# Patient Record
Sex: Female | Born: 1955
Health system: Southern US, Community
[De-identification: ages and names within clinical notes are randomized; demographics above are authoritative.]

## PROBLEM LIST (undated history)

## (undated) DIAGNOSIS — G4733 Obstructive sleep apnea (adult) (pediatric): Principal | ICD-10-CM

## (undated) DIAGNOSIS — D869 Sarcoidosis, unspecified: Secondary | ICD-10-CM

## (undated) DIAGNOSIS — E039 Hypothyroidism, unspecified: Secondary | ICD-10-CM

## (undated) DIAGNOSIS — R5383 Other fatigue: Secondary | ICD-10-CM

## (undated) DIAGNOSIS — E669 Obesity, unspecified: Secondary | ICD-10-CM

## (undated) DIAGNOSIS — R5381 Other malaise: Secondary | ICD-10-CM

## (undated) DIAGNOSIS — K219 Gastro-esophageal reflux disease without esophagitis: Secondary | ICD-10-CM

## (undated) HISTORY — DX: Obstructive sleep apnea (adult) (pediatric): G47.33

## (undated) HISTORY — DX: Hypothyroidism, unspecified: E03.9

## (undated) HISTORY — PX: ABDOMINAL HYSTERECTOMY: SHX81

## (undated) HISTORY — DX: Obesity, unspecified: E66.9

## (undated) HISTORY — DX: Other malaise: R53.81

## (undated) HISTORY — PX: TONSILLECTOMY: SUR1361

## (undated) HISTORY — DX: Sarcoidosis, unspecified: D86.9

## (undated) HISTORY — DX: Other fatigue: R53.83

## (undated) HISTORY — DX: Gastro-esophageal reflux disease without esophagitis: K21.9

---

## 2011-09-16 ENCOUNTER — Other Ambulatory Visit (HOSPITAL_COMMUNITY): Payer: Self-pay | Admitting: Physician Assistant

## 2011-09-16 DIAGNOSIS — Z0000 Encounter for general adult medical examination without abnormal findings: Secondary | ICD-10-CM

## 2011-09-28 HISTORY — PX: COLONOSCOPY: SHX174

## 2011-09-28 HISTORY — PX: ESOPHAGOGASTRODUODENOSCOPY: SHX1529

## 2011-10-03 ENCOUNTER — Ambulatory Visit (HOSPITAL_COMMUNITY)
Admission: RE | Admit: 2011-10-03 | Discharge: 2011-10-03 | Disposition: A | Discharge status: Home / Self Care | Payer: BC Managed Care – PPO | Source: Ambulatory Visit | Attending: Physician Assistant | Admitting: Physician Assistant

## 2011-10-03 DIAGNOSIS — Z0000 Encounter for general adult medical examination without abnormal findings: Secondary | ICD-10-CM

## 2011-10-03 DIAGNOSIS — Z1231 Encounter for screening mammogram for malignant neoplasm of breast: Secondary | ICD-10-CM | POA: Insufficient documentation

## 2011-10-06 ENCOUNTER — Other Ambulatory Visit: Payer: Self-pay | Admitting: Physician Assistant

## 2011-10-06 DIAGNOSIS — R928 Other abnormal and inconclusive findings on diagnostic imaging of breast: Secondary | ICD-10-CM

## 2011-10-19 ENCOUNTER — Ambulatory Visit (HOSPITAL_COMMUNITY)
Admission: RE | Admit: 2011-10-19 | Discharge: 2011-10-19 | Disposition: A | Discharge status: Home / Self Care | Payer: BC Managed Care – PPO | Source: Ambulatory Visit | Attending: Physician Assistant | Admitting: Physician Assistant

## 2011-10-19 DIAGNOSIS — R928 Other abnormal and inconclusive findings on diagnostic imaging of breast: Secondary | ICD-10-CM

## 2011-10-28 ENCOUNTER — Other Ambulatory Visit: Payer: Self-pay | Admitting: Gastroenterology

## 2011-10-28 DIAGNOSIS — R110 Nausea: Secondary | ICD-10-CM

## 2011-10-28 DIAGNOSIS — R1013 Epigastric pain: Secondary | ICD-10-CM

## 2011-10-31 ENCOUNTER — Ambulatory Visit
Admission: RE | Admit: 2011-10-31 | Discharge: 2011-10-31 | Disposition: A | Discharge status: Home / Self Care | Payer: BC Managed Care – PPO | Source: Ambulatory Visit | Attending: Gastroenterology | Admitting: Gastroenterology

## 2011-10-31 DIAGNOSIS — R110 Nausea: Secondary | ICD-10-CM

## 2011-10-31 DIAGNOSIS — R1013 Epigastric pain: Secondary | ICD-10-CM

## 2012-05-02 ENCOUNTER — Other Ambulatory Visit (HOSPITAL_COMMUNITY): Payer: Self-pay | Admitting: Internal Medicine

## 2012-05-02 DIAGNOSIS — Z09 Encounter for follow-up examination after completed treatment for conditions other than malignant neoplasm: Secondary | ICD-10-CM

## 2012-05-09 ENCOUNTER — Ambulatory Visit (HOSPITAL_COMMUNITY)
Admission: RE | Admit: 2012-05-09 | Discharge: 2012-05-09 | Disposition: A | Discharge status: Home / Self Care | Payer: BC Managed Care – PPO | Source: Ambulatory Visit | Attending: Internal Medicine | Admitting: Internal Medicine

## 2012-05-09 DIAGNOSIS — Z09 Encounter for follow-up examination after completed treatment for conditions other than malignant neoplasm: Secondary | ICD-10-CM

## 2012-09-04 ENCOUNTER — Other Ambulatory Visit (HOSPITAL_COMMUNITY): Payer: Self-pay | Admitting: Family Medicine

## 2012-09-04 DIAGNOSIS — Z09 Encounter for follow-up examination after completed treatment for conditions other than malignant neoplasm: Secondary | ICD-10-CM

## 2012-10-10 ENCOUNTER — Ambulatory Visit (HOSPITAL_COMMUNITY)
Admission: RE | Admit: 2012-10-10 | Discharge: 2012-10-10 | Disposition: A | Discharge status: Home / Self Care | Payer: BC Managed Care – PPO | Source: Ambulatory Visit | Attending: Family Medicine | Admitting: Family Medicine

## 2012-10-10 DIAGNOSIS — R928 Other abnormal and inconclusive findings on diagnostic imaging of breast: Secondary | ICD-10-CM | POA: Insufficient documentation

## 2012-10-10 DIAGNOSIS — Z09 Encounter for follow-up examination after completed treatment for conditions other than malignant neoplasm: Secondary | ICD-10-CM | POA: Insufficient documentation

## 2013-04-23 ENCOUNTER — Ambulatory Visit (INDEPENDENT_AMBULATORY_CARE_PROVIDER_SITE_OTHER): Payer: BC Managed Care – PPO | Admitting: Neurology

## 2013-04-23 ENCOUNTER — Encounter (INDEPENDENT_AMBULATORY_CARE_PROVIDER_SITE_OTHER): Payer: Self-pay

## 2013-04-23 ENCOUNTER — Encounter: Payer: Self-pay | Admitting: Neurology

## 2013-04-23 VITALS — BP 129/79 | HR 64 | Ht 62.0 in | Wt 192.0 lb

## 2013-04-23 DIAGNOSIS — G4733 Obstructive sleep apnea (adult) (pediatric): Secondary | ICD-10-CM | POA: Insufficient documentation

## 2013-04-23 HISTORY — DX: Obstructive sleep apnea (adult) (pediatric): G47.33

## 2013-04-23 NOTE — Patient Instructions (Signed)

## 2013-04-23 NOTE — Progress Notes (Signed)
Subjective:    Patient ID: Brooke Herring is a 57 y.o. female.  HPI   Brooke Foley, MD, PhD [facility] [address] [address] [place], [place] [address]  Dear Brooke Herring,  I saw your patient, Brooke Herring, upon your kind request in my neurologic clinic today for initial consultation of her sleep disorder, in particular concern for obstructive sleep apnea. The patient is unaccompanied today. As you know, Brooke Herring is a very friendly 57 year old right-handed woman with an underlying medical history of hypothyroidism, reflux disease, and obesity, who reports loud snoring and daytime somnolence as well as apneic pauses while asleep. She has recently made some changes in her lifestyle and is exercising, trying to lose weight. She has lost 6 lb in 2 months.  Her typical bedtime is reported to be around 10:30 PM and usual wake time is around 6:30 or 7 AM. Sleep onset typically occurs within a few minutes. She reports feeling well rested upon awakening. She wakes up on an average 2 to 3 times in the middle of the night and has to go to the bathroom 1 to 2 times on a typical night. She denies morning headaches.  She reports excessive daytime somnolence (EDS) and Her Epworth Sleepiness Score (ESS) is 13/24 today. She has not fallen asleep while driving. The patient often takes an afternoon nap, lasting 20-30 minutes. She reports feeling refreshed after a nap.  She has been known to snore for the past few years. Snoring is reportedly marked, and associated with choking sounds and witnessed apneas. The patient admits to waking up in a startle and in a panic. She generally denies a sense of choking or strangling feeling. There is no report of nighttime reflux, but she has to take Zantac. There is no nighttime cough experienced. The patient has not noted any RLS symptoms and is not known to kick while asleep or before falling asleep. There is a family history of OSA in her nephews. She  has a strong FHx of cardiac d/s. One brother passed away at age 57 from a MI. Her uncles had MIs and her father died of a MI.  She is a restless sleeper and in the morning, the bed is quite disheveled.   She denies cataplexy, sleep paralysis, hypnagogic or hypnopompic hallucinations, or sleep attacks. She does not report any vivid dreams, nightmares, dream enactments, or parasomnias, such as sleep talking or sleep walking. The patient has not had a sleep study or a home sleep test.  She consumes 4 caffeinated beverages per day, usually in the form of 2 cups of coffee in the morning and sweetened tea as late as dinner, which is at 5:30 PM.  Her bedroom is usually dark and cool. There is a TV in the bedroom and usually it is not on at night.   Her Past Medical History Is Significant For: Past Medical History  Diagnosis Date  . Unspecified hypothyroidism   . Obesity, unspecified   . Other malaise and fatigue   . OSA (obstructive sleep apnea) 04/23/2013    Her Past Surgical History Is Significant For: History reviewed. No pertinent past surgical history.  Her Family History Is Significant For: No family history on file.  Her Social History Is Significant For: History   Social History  . Marital Status: Married    Spouse Name: N/A    Number of Children: N/A  . Years of Education: N/A   Social History Main Topics  .  Smoking status: Former Smoker    Quit date: 04/24/2011  . Smokeless tobacco: None  . Alcohol Use: No  . Drug Use: No  . Sexual Activity: None   Other Topics Concern  . None   Social History Narrative  . None    Her Allergies Are:  Allergies  Allergen Reactions  . Azithromycin Other (See Comments)    Severe  HA  :   Her Current Medications Are:  Outpatient Encounter Prescriptions as of 04/23/2013  Medication Sig  . aspirin 81 MG tablet Take 81 mg by mouth daily.  Marland Kitchen levothyroxine (SYNTHROID, LEVOTHROID) 75 MCG tablet Take 75 mcg by mouth daily before  breakfast.  . ranitidine (ZANTAC) 150 MG tablet Take 150 mg by mouth at bedtime.  . Multiple Vitamin (MULTIVITAMIN) tablet Take 1 tablet by mouth daily.  :  Review of Systems:  Out of a complete 14 point review of systems, all are reviewed and negative with the exception of these symptoms as listed below:   Review of Systems  Constitutional: Positive for fatigue and unexpected weight change.  HENT: Negative.   Eyes: Negative.   Respiratory:       Snoring  Cardiovascular: Negative.   Gastrointestinal: Negative.   Endocrine: Positive for heat intolerance.  Genitourinary: Negative.   Musculoskeletal: Negative.   Skin: Negative.   Allergic/Immunologic: Negative.   Neurological: Negative.   Hematological: Negative.   Psychiatric/Behavioral: Positive for sleep disturbance.    Objective:  Neurologic Exam  Physical Exam Physical Examination:   Filed Vitals:   04/23/13 1344  BP: 129/79  Pulse: 64    General Examination: The patient is a very pleasant 57 y.o. female in no acute distress. She appears well-developed and well-nourished and very well groomed.   HEENT: Normocephalic, atraumatic, pupils are equal, round and reactive to light and accommodation. Funduscopic exam is normal with sharp disc margins noted. Extraocular tracking is good without limitation to gaze excursion or nystagmus noted. Normal smooth pursuit is noted. Hearing is grossly intact. Tympanic membranes are clear bilaterally. Face is symmetric with normal facial animation and normal facial sensation. Speech is clear with no dysarthria noted. There is no hypophonia. There is no lip, neck/head, jaw or voice tremor. Neck is supple with full range of passive and active motion. There are no carotid bruits on auscultation. Oropharynx exam reveals: mild mouth dryness, adequate dental hygiene and moderate airway crowding, due to redundant soft palate and larger tongue. Mallampati is class II. Tongue protrudes centrally and  palate elevates symmetrically. Tonsils are absent. Neck size is 14.5 inches.   Chest: Clear to auscultation without wheezing, rhonchi or crackles noted.  Heart: S1+S2+0, regular and normal without murmurs, rubs or gallops noted.   Abdomen: Soft, non-tender and non-distended with normal bowel sounds appreciated on auscultation.  Extremities: There is no pitting edema in the distal lower extremities bilaterally. Pedal pulses are intact.  Skin: Warm and dry without trophic changes noted. There are no varicose veins.  Musculoskeletal: exam reveals no obvious joint deformities, tenderness or joint swelling or erythema.   Neurologically:  Mental status: The patient is awake, alert and oriented in all 4 spheres. Her memory, attention, language and knowledge are appropriate. There is no aphasia, agnosia, apraxia or anomia. Speech is clear with normal prosody and enunciation. Thought process is linear. Mood is congruent and affect is normal.  Cranial nerves are as described above under HEENT exam. In addition, shoulder shrug is normal with equal shoulder height noted. Motor exam: Normal bulk,  strength and tone is noted. There is no drift, tremor or rebound. Romberg is negative. Reflexes are 2+ throughout. Toes are downgoing bilaterally. Fine motor skills are intact with normal finger taps, normal hand movements, normal rapid alternating patting, normal foot taps and normal foot agility.  Cerebellar testing shows no dysmetria or intention tremor on finger to nose testing. Heel to shin is unremarkable bilaterally. There is no truncal or gait ataxia.  Sensory exam is intact to light touch, pinprick, vibration, temperature sense and proprioception in the upper and lower extremities.  Gait, station and balance are unremarkable. No veering to one side is noted. No leaning to one side is noted. Posture is age-appropriate and stance is narrow based. No problems turning are noted. She turns en bloc. Tandem walk is  unremarkable. Intact toe and heel stance is noted.                Assessment and Plan:   In summary, Brooke Herring is a very pleasant 57 y.o. female with a history and physical exam concerning for obstructive sleep apnea (OSA). I had a long chat with the patient about my findings and the diagnosis, its prognosis and treatment options. We talked about medical treatments and non-pharmacological approaches. I explained in particular the risks and ramifications of untreated moderate to severe OSA, especially with respect to developing cardiovascular disease down the Road, including congestive heart failure, difficult to treat hypertension, cardiac arrhythmias, or stroke. Even type 2 diabetes has in part been linked to untreated OSA. We talked about trying to maintain a healthy lifestyle in general, as well as the importance of weight control. I encouraged the patient to eat healthy, exercise daily and keep well hydrated, to keep a scheduled bedtime and wake time routine, to not skip any meals and eat healthy snacks in between meals.  I recommended the following at this time: sleep study with potential positive airway pressure titration.  I explained the sleep test procedure to the patient and also outlined possible surgical and non-surgical treatment options of OSA, including the use of a custom-made dental device, upper airway surgical options, such as pillar implants, radiofrequency surgery, tongue base surgery, and UPPP. I also explained the CPAP treatment option to the patient, who indicated that she would be willing to try CPAP if the need arises. I explained the importance of being compliant with PAP treatment, not only for insurance purposes but primarily to improve Her symptoms, and for the patient's long term health benefit, including to reduce Her cardiovascular risks. I answered all her questions today and the patient was in agreement. I would like to see her back after the sleep study is completed and  encouraged her to call with any interim questions, concerns, problems or updates.   Thank you very much for allowing me to participate in the care of this nice patient. If I can be of any further assistance to you please do not hesitate to call me at [phone].  Sincerely,   Brooke Foley, MD, PhD

## 2013-05-20 ENCOUNTER — Ambulatory Visit (INDEPENDENT_AMBULATORY_CARE_PROVIDER_SITE_OTHER): Payer: BC Managed Care – PPO

## 2013-05-20 DIAGNOSIS — G4733 Obstructive sleep apnea (adult) (pediatric): Secondary | ICD-10-CM

## 2013-05-20 DIAGNOSIS — G479 Sleep disorder, unspecified: Secondary | ICD-10-CM

## 2013-06-05 ENCOUNTER — Telehealth: Payer: Self-pay | Admitting: Neurology

## 2013-06-05 DIAGNOSIS — G4733 Obstructive sleep apnea (adult) (pediatric): Secondary | ICD-10-CM

## 2013-06-05 NOTE — Telephone Encounter (Signed)
Please call and notify the patient that the recent sleep study did confirm the diagnosis of obstructive sleep apnea and that I recommend treatment for this in the form of CPAP. This will require a repeat sleep study for proper titration and mask fitting. Please explain to patient and arrange for a CPAP titration study. I have placed an order in the chart. Thanks, Roiza Wiedel, MD, PhD [facility]  

## 2013-06-06 ENCOUNTER — Encounter: Payer: Self-pay | Admitting: *Deleted

## 2013-06-06 NOTE — Telephone Encounter (Signed)
I called and spoke with the patient about her recent sleep study results. I informed the patient that the study confirmed the diagnosis of obstructive sleep apnea and Dr. Rexene Alberts recommend CPAP therapy. Patient understood that she will need to come back into the lab for another overnight study using CPAP for proper titration and mask fitting. Patient has agreed on a date and time. I will fax a copy to Delman Cheadle, PA-C and Dr. Loraine Leriche office.

## 2013-06-13 ENCOUNTER — Ambulatory Visit (INDEPENDENT_AMBULATORY_CARE_PROVIDER_SITE_OTHER): Payer: BC Managed Care – PPO

## 2013-06-13 DIAGNOSIS — G479 Sleep disorder, unspecified: Secondary | ICD-10-CM

## 2013-06-13 DIAGNOSIS — G4733 Obstructive sleep apnea (adult) (pediatric): Secondary | ICD-10-CM

## 2013-06-19 ENCOUNTER — Telehealth: Payer: Self-pay | Admitting: Neurology

## 2013-06-19 DIAGNOSIS — G4733 Obstructive sleep apnea (adult) (pediatric): Secondary | ICD-10-CM

## 2013-06-19 NOTE — Telephone Encounter (Signed)
Please call and inform patient that I have entered an order for treatment with PAP. She did well during the latest sleep study with CPAP. We will, therefore, arrange for a machine for home use through a DME (durable medical equipment) company of Her choice; and I will see the patient back in follow-up in about 6 weeks. Please also explain to the patient that I will be looking out for compliance data downloaded from the machine, which can be done remotely through a modem at times or stored on an SD card in the back of the machine. At the time of the followup appointment we will discuss sleep study results and how it is going with PAP treatment at home. Please advise patient to bring Her machine at the time of the visit; at least for the first visit, even though this is cumbersome. Bringing the machine for every visit after that may not be needed, but often helps for the first visit. Please also make sure, the patient has a follow-up appointment with me in about 6 weeks from the setup date, thanks.   Micah Galeno, MD, PhD [facility]  

## 2013-06-20 ENCOUNTER — Encounter: Payer: Self-pay | Admitting: *Deleted

## 2013-06-20 NOTE — Telephone Encounter (Signed)
I called and spoke with the patient about her sleep study results. I informed the patient that she did well on CPAP during the night of her study and Dr. Rexene Alberts recommend CPAP therapy at home. I will send the order to [facility] and they will contact the patient once receiving benefits for the CPAP machine. I also informed the patient that I will fax a copy of the report to Delman Cheadle, PA-C and mail a copy of the report along with a follow up instruction letter to the patient.

## 2013-09-19 ENCOUNTER — Emergency Department (HOSPITAL_COMMUNITY)
Admission: EM | Admit: 2013-09-19 | Discharge: 2013-09-19 | Disposition: A | Discharge status: Home / Self Care | Payer: BC Managed Care – PPO | Attending: Emergency Medicine | Admitting: Emergency Medicine

## 2013-09-19 ENCOUNTER — Encounter (HOSPITAL_COMMUNITY): Payer: Self-pay | Admitting: Emergency Medicine

## 2013-09-19 DIAGNOSIS — E039 Hypothyroidism, unspecified: Secondary | ICD-10-CM | POA: Insufficient documentation

## 2013-09-19 DIAGNOSIS — Z7982 Long term (current) use of aspirin: Secondary | ICD-10-CM | POA: Insufficient documentation

## 2013-09-19 DIAGNOSIS — E669 Obesity, unspecified: Secondary | ICD-10-CM | POA: Insufficient documentation

## 2013-09-19 DIAGNOSIS — H81399 Other peripheral vertigo, unspecified ear: Secondary | ICD-10-CM | POA: Insufficient documentation

## 2013-09-19 DIAGNOSIS — Z8669 Personal history of other diseases of the nervous system and sense organs: Secondary | ICD-10-CM | POA: Insufficient documentation

## 2013-09-19 DIAGNOSIS — Z79899 Other long term (current) drug therapy: Secondary | ICD-10-CM | POA: Insufficient documentation

## 2013-09-19 DIAGNOSIS — Z87891 Personal history of nicotine dependence: Secondary | ICD-10-CM | POA: Insufficient documentation

## 2013-09-19 LAB — CBC WITH DIFFERENTIAL/PLATELET
BASOS ABS: 0 10*3/uL (ref 0.0–0.1)
Basophils Relative: 1 % (ref 0–1)
Eosinophils Absolute: 0.2 10*3/uL (ref 0.0–0.7)
Eosinophils Relative: 3 % (ref 0–5)
HCT: 42.9 % (ref 36.0–46.0)
Hemoglobin: 13.7 g/dL (ref 12.0–15.0)
LYMPHS PCT: 37 % (ref 12–46)
Lymphs Abs: 1.9 10*3/uL (ref 0.7–4.0)
MCH: 30.4 pg (ref 26.0–34.0)
MCHC: 31.9 g/dL (ref 30.0–36.0)
MCV: 95.3 fL (ref 78.0–100.0)
Monocytes Absolute: 0.6 10*3/uL (ref 0.1–1.0)
Monocytes Relative: 11 % (ref 3–12)
NEUTROS ABS: 2.4 10*3/uL (ref 1.7–7.7)
NEUTROS PCT: 48 % (ref 43–77)
PLATELETS: 243 10*3/uL (ref 150–400)
RBC: 4.5 MIL/uL (ref 3.87–5.11)
RDW: 13.3 % (ref 11.5–15.5)
WBC: 5.1 10*3/uL (ref 4.0–10.5)

## 2013-09-19 LAB — BASIC METABOLIC PANEL
BUN: 13 mg/dL (ref 6–23)
CALCIUM: 9.4 mg/dL (ref 8.4–10.5)
CHLORIDE: 105 meq/L (ref 96–112)
CO2: 29 meq/L (ref 19–32)
Creatinine, Ser: 0.94 mg/dL (ref 0.50–1.10)
GFR calc Af Amer: 75 mL/min — ABNORMAL LOW (ref >90)
GFR calc non Af Amer: 65 mL/min — ABNORMAL LOW (ref >90)
Glucose, Bld: 106 mg/dL — ABNORMAL HIGH (ref 70–99)
Potassium: 4 mEq/L (ref 3.7–5.3)
SODIUM: 142 meq/L (ref 137–147)

## 2013-09-19 MED ORDER — LORAZEPAM 2 MG/ML IJ SOLN
1.0000 mg | Freq: Once | INTRAMUSCULAR | Status: AC
  Administered 2013-09-19: 1 mg via INTRAVENOUS
  Filled 2013-09-19: qty 1

## 2013-09-19 MED ORDER — MECLIZINE HCL 25 MG PO TABS: 25.0000 mg | ORAL_TABLET | Freq: Three times a day (TID) | ORAL | Status: DC | PRN

## 2013-09-19 MED ORDER — ONDANSETRON HCL 4 MG PO TABS: 4.0000 mg | ORAL_TABLET | Freq: Four times a day (QID) | ORAL | Status: DC | PRN

## 2013-09-19 MED ORDER — ONDANSETRON HCL 4 MG/2ML IJ SOLN
4.0000 mg | Freq: Once | INTRAMUSCULAR | Status: AC
  Administered 2013-09-19: 4 mg via INTRAVENOUS
  Filled 2013-09-19: qty 2

## 2013-09-19 MED ORDER — MECLIZINE HCL 12.5 MG PO TABS
25.0000 mg | ORAL_TABLET | Freq: Once | ORAL | Status: AC
  Administered 2013-09-19: 25 mg via ORAL
  Filled 2013-09-19: qty 2

## 2013-09-19 NOTE — ED Notes (Signed)
Dr. Roxanne Mins at bedside to reassess.

## 2013-09-19 NOTE — ED Notes (Signed)
Per EMS, patient felt fine when she went to bed last night.  She states she got up this morning and felt like something came loose in her brain.  Patient c/o not being able to sit up or stand without feeling like she's going to pass out.

## 2013-09-19 NOTE — ED Notes (Signed)
Patient ambulated around nurse's station with steady gait. Returned to room without complication.

## 2013-09-19 NOTE — ED Notes (Signed)
Patient with no complaints at this time. Respirations even and unlabored. Skin warm/dry. Discharge instructions reviewed with patient at this time. Patient given opportunity to voice concerns/ask questions. IV removed per policy and band-aid applied to site. Patient discharged at this time and left Emergency Department with steady gait.  

## 2013-09-19 NOTE — ED Notes (Signed)
Patient is able to turn over to her side without getting dizzy.  HOB elevated to 30 degrees without dizziness.

## 2013-09-19 NOTE — Discharge Instructions (Signed)
Vertigo Vertigo means you feel like you or your surroundings are moving when they are not. Vertigo can be dangerous if it occurs when you are at work, driving, or performing difficult activities.  CAUSES  Vertigo occurs when there is a conflict of signals sent to your brain from the visual and sensory systems in your body. There are many different causes of vertigo, including:  Infections, especially in the inner ear.  A bad reaction to a drug or misuse of alcohol and medicines.  Withdrawal from drugs or alcohol.  Rapidly changing positions, such as lying down or rolling over in bed.  A migraine headache.  Decreased blood flow to the brain.  Increased pressure in the brain from a head injury, infection, tumor, or bleeding. SYMPTOMS  You may feel as though the world is spinning around or you are falling to the ground. Because your balance is upset, vertigo can cause nausea and vomiting. You may have involuntary eye movements (nystagmus). DIAGNOSIS  Vertigo is usually diagnosed by physical exam. If the cause of your vertigo is unknown, your caregiver may perform imaging tests, such as an MRI scan (magnetic resonance imaging). TREATMENT  Most cases of vertigo resolve on their own, without treatment. Depending on the cause, your caregiver may prescribe certain medicines. If your vertigo is related to body position issues, your caregiver may recommend movements or procedures to correct the problem. In rare cases, if your vertigo is caused by certain inner ear problems, you may need surgery. HOME CARE INSTRUCTIONS   Follow your caregiver's instructions.  Avoid driving.  Avoid operating heavy machinery.  Avoid performing any tasks that would be dangerous to you or others during a vertigo episode.  Tell your caregiver if you notice that certain medicines seem to be causing your vertigo. Some of the medicines used to treat vertigo episodes can actually make them worse in some people. SEEK  IMMEDIATE MEDICAL CARE IF:   Your medicines do not relieve your vertigo or are making it worse.  You develop problems with talking, walking, weakness, or using your arms, hands, or legs.  You develop severe headaches.  Your nausea or vomiting continues or gets worse.  You develop visual changes.  A family member notices behavioral changes.  Your condition gets worse. MAKE SURE YOU:  Understand these instructions.  Will watch your condition.  Will get help right away if you are not doing well or get worse. Document Released: 02/23/2005 Document Revised: 08/08/2011 Document Reviewed: 12/02/2010 [facility] Patient Information 2014 [employer].  Meclizine tablets or capsules What is this medicine? MECLIZINE (MEK li zeen) is an antihistamine. It is used to prevent nausea, vomiting, or dizziness caused by motion sickness. It is also used to prevent and treat vertigo (extreme dizziness or a feeling that you or your surroundings are tilting or spinning around). This medicine may be used for other purposes; ask your health care provider or pharmacist if you have questions. COMMON BRAND NAME(S): Antivert, Dramamine Less Drowsy, Medivert, Meni-D  What should I tell my health care provider before I take this medicine? They need to know if you have any of these conditions: -asthma -glaucoma -prostate trouble -stomach problems -urinary problems -an unusual or allergic reaction to meclizine, other medicines, foods, dyes, or preservatives -pregnant or trying to get pregnant -breast-feeding How should I use this medicine? Take this medicine by mouth with a glass of water. Follow the directions on the prescription label. If you are using this medicine to prevent motion sickness, take  the dose at least 1 hour before travel. If it upsets your stomach, take it with food or milk. Take your doses at regular intervals. Do not take your medicine more often than directed. °Talk to your pediatrician  regarding the use of this medicine in children. Special care may be needed. °Overdosage: If you think you have taken too much of this medicine contact a poison control center or emergency room at once. °NOTE: This medicine is only for you. Do not share this medicine with others. °What if I miss a dose? °If you miss a dose, take it as soon as you can. If it is almost time for your next dose, take only that dose. Do not take double or extra doses. °What may interact with this medicine? °-barbiturate medicines for inducing sleep or treating seizures °-digoxin °-medicines for anxiety or sleeping problems, like alprazolam, diazepam or temazepam °-medicines for hay fever and other allergies °-medicines for mental depression °-medicines for movement abnormalities as in Parkinson's disease, or for stomach problems °-medicines for pain °-medicines that relax muscles °This list may not describe all possible interactions. Give your health care provider a list of all the medicines, herbs, non-prescription drugs, or dietary supplements you use. Also tell them if you smoke, drink alcohol, or use illegal drugs. Some items may interact with your medicine. °What should I watch for while using this medicine? °If you are taking this medicine on a regular schedule, visit your doctor or health care professional for regular checks on your progress. °You may get dizzy, drowsy or have blurred vision. Do not drive, use machinery, or do anything that needs mental alertness until you know how this medicine affects you. Do not stand or sit up quickly, especially if you are an older patient. This reduces the risk of dizzy or fainting spells. Alcohol can increase possible dizziness. Avoid alcoholic drinks. °Your mouth may get dry. Chewing sugarless gum or sucking hard candy, and drinking plenty of water may help. Contact your doctor if the problem does not go away or is severe. °This medicine may cause dry eyes and blurred vision. If you wear  contact lenses you may feel some discomfort. Lubricating drops may help. See your eye doctor if the problem does not go away or is severe. °What side effects may I notice from receiving this medicine? °Side effects that you should report to your doctor or health care professional as soon as possible: °-fainting spells °-fast or irregular heartbeat °Side effects that usually do not require medical attention (report to your doctor or health care professional if they continue or are bothersome): °-constipation °-difficulty passing urine °-difficulty sleeping °-headache °-stomach upset °This list may not describe all possible side effects. Call your doctor for medical advice about side effects. You may report side effects to FDA at 1-800-FDA-1088. °Where should I keep my medicine? °Keep out of the reach of children. °Store at room temperature between 15 and 30 degrees C (59 and 86 degrees F). Keep container tightly closed. Throw away any unused medicine after the expiration date. °NOTE: This sheet is a summary. It may not cover all possible information. If you have questions about this medicine, talk to your doctor, pharmacist, or health care provider. °© 2014, [employer]. (2007-11-22 10:35:36) ° °

## 2013-09-19 NOTE — ED Notes (Signed)
Patient attempted to use bedpan without success; states she cannot use the bathroom lying down.  I&O cath completed and patient states she feels better.

## 2013-09-19 NOTE — ED Provider Notes (Signed)
CSN: [id number]     Arrival date & time 09/19/13  4166 History   First MD Initiated Contact with Patient 09/19/13 0531     Chief Complaint  Patient presents with  . Dizziness     (Consider location/radiation/quality/duration/timing/severity/associated sxs/prior Treatment) Patient is a 58 y.o. female presenting with dizziness. The history is provided by the patient.  Dizziness She woke up to go to the bathroom and when she sat up, and she had a feeling like something came loose in her brain. She felt like she was going to pass out and there is associated nausea. She lay down and felt fine. Since then, anytime she tries to sit up, symptoms recur. She denies headache or visual change. She denies hearing loss or tinnitus and denies ear pain. There's been no vomiting. She felt fine when she went to bed. She's never had anything like this happen before.  Past Medical History  Diagnosis Date  . Unspecified hypothyroidism   . Obesity, unspecified   . Other malaise and fatigue   . OSA (obstructive sleep apnea) 04/23/2013   History reviewed. No pertinent past surgical history. No family history on file. History  Substance Use Topics  . Smoking status: Former Smoker    Quit date: 04/24/2011  . Smokeless tobacco: Not on file  . Alcohol Use: No   OB History   Grav Para Term Preterm Abortions TAB SAB Ect Mult Living                 Review of Systems  Neurological: Positive for dizziness.  All other systems reviewed and are negative.     Allergies  Azithromycin  Home Medications   Prior to Admission medications   Medication Sig Start Date End Date Taking? Authorizing Provider  aspirin 81 MG tablet Take 81 mg by mouth daily.   Yes Historical Provider, MD  levothyroxine (SYNTHROID, LEVOTHROID) 75 MCG tablet Take 75 mcg by mouth daily before breakfast.   Yes Historical Provider, MD  Multiple Vitamin (MULTIVITAMIN) tablet Take 1 tablet by mouth daily.   Yes Historical Provider, MD   ranitidine (ZANTAC) 150 MG tablet Take 150 mg by mouth at bedtime.   Yes Historical Provider, MD   BP 141/76  Pulse 65  Temp(Src) 98.3 F (36.8 C) (Oral)  Resp 18  Ht 5\' 3"  (1.6 m)  Wt 197 lb (89.359 kg)  BMI 34.91 kg/m2  SpO2 99% Physical Exam  Nursing note and vitals reviewed.  58 year old female, resting comfortably and in no acute distress. Vital signs are significant for borderline hypertension with blood pressure 141/76. Oxygen saturation is 99%, which is normal. Head is normocephalic and atraumatic. PERRLA, EOMI. Oropharynx is clear. There is no nystagmus. Neck is nontender and supple without adenopathy or JVD. Back is nontender and there is no CVA tenderness. Lungs are clear without rales, wheezes, or rhonchi. Chest is nontender. Heart has regular rate and rhythm without murmur. Abdomen is soft, flat, nontender without masses or hepatosplenomegaly and peristalsis is normoactive. Extremities have no cyanosis or edema, full range of motion is present. Skin is warm and dry without rash. Neurologic: Mental status is normal, cranial nerves are intact, there are no motor or sensory deficits. Symptoms are reproduced by head movement, raising head past 15 degrees.  ED Course  Procedures (including critical care time) Labs Review Results for orders placed during the hospital encounter of 09/19/13  CBC WITH DIFFERENTIAL      Result Value Ref Range   WBC 5.1  4.0 - 10.5 K/uL   RBC 4.50  3.87 - 5.11 MIL/uL   Hemoglobin 13.7  12.0 - 15.0 g/dL   HCT 42.9  36.0 - 46.0 %   MCV 95.3  78.0 - 100.0 fL   MCH 30.4  26.0 - 34.0 pg   MCHC 31.9  30.0 - 36.0 g/dL   RDW 13.3  11.5 - 15.5 %   Platelets 243  150 - 400 K/uL   Neutrophils Relative % 48  43 - 77 %   Neutro Abs 2.4  1.7 - 7.7 K/uL   Lymphocytes Relative 37  12 - 46 %   Lymphs Abs 1.9  0.7 - 4.0 K/uL   Monocytes Relative 11  3 - 12 %   Monocytes Absolute 0.6  0.1 - 1.0 K/uL   Eosinophils Relative 3  0 - 5 %   Eosinophils  Absolute 0.2  0.0 - 0.7 K/uL   Basophils Relative 1  0 - 1 %   Basophils Absolute 0.0  0.0 - 0.1 K/uL  BASIC METABOLIC PANEL      Result Value Ref Range   Sodium 142  137 - 147 mEq/L   Potassium 4.0  3.7 - 5.3 mEq/L   Chloride 105  96 - 112 mEq/L   CO2 29  19 - 32 mEq/L   Glucose, Bld 106 (*) 70 - 99 mg/dL   BUN 13  6 - 23 mg/dL   Creatinine, Ser 0.94  0.50 - 1.10 mg/dL   Calcium 9.4  8.4 - 10.5 mg/dL   GFR calc non Af Amer 65 (*) >90 mL/min   GFR calc Af Amer 75 (*) >90 mL/min   MDM   Final diagnoses:  Peripheral vertigo    Acute vertigo with pattern most consistent with peripheral vertigo. She'll be given to repeat a trial of meclizine. Consider MRI if she does not have adequate clinical response.  7:07 AM Nausea has completely resolved following ondansetron. Following oral meclizine, and she is now able to sit up although she still has some mild dizziness when she sits. She will be given a dose of lorazepam and she will be ambulated with assistance in the ED.  8:06 AM The patient successfully intubated with assistance and then was able to ambulate without assistance. She is stable when ambulating and walks with a steady gait. She is felt to be safe for discharge. She is discharged with prescriptions for meclizine and ondansetron.  Delora Fuel, MD 19/50/93 2671

## 2013-10-23 ENCOUNTER — Other Ambulatory Visit (HOSPITAL_COMMUNITY): Payer: Self-pay | Admitting: Internal Medicine

## 2013-10-23 DIAGNOSIS — Z1231 Encounter for screening mammogram for malignant neoplasm of breast: Secondary | ICD-10-CM

## 2013-10-29 ENCOUNTER — Ambulatory Visit (HOSPITAL_COMMUNITY)
Admission: RE | Admit: 2013-10-29 | Discharge: 2013-10-29 | Disposition: A | Discharge status: Home / Self Care | Payer: BC Managed Care – PPO | Source: Ambulatory Visit | Attending: Internal Medicine | Admitting: Internal Medicine

## 2013-10-29 DIAGNOSIS — Z1231 Encounter for screening mammogram for malignant neoplasm of breast: Secondary | ICD-10-CM

## 2014-01-08 ENCOUNTER — Telehealth: Payer: Self-pay

## 2014-01-08 NOTE — Telephone Encounter (Signed)
Tried to call back to make an appointment with no answer

## 2014-01-15 NOTE — Telephone Encounter (Signed)
Pt is aware of her appointment on August 25 @ 1100

## 2014-01-21 ENCOUNTER — Encounter: Payer: Self-pay | Admitting: Gastroenterology

## 2014-01-21 ENCOUNTER — Ambulatory Visit (INDEPENDENT_AMBULATORY_CARE_PROVIDER_SITE_OTHER): Payer: BC Managed Care – PPO | Admitting: Gastroenterology

## 2014-01-21 ENCOUNTER — Encounter (INDEPENDENT_AMBULATORY_CARE_PROVIDER_SITE_OTHER): Payer: Self-pay

## 2014-01-21 VITALS — BP 130/70 | HR 75 | Temp 98.0°F | Ht 63.0 in | Wt 202.6 lb

## 2014-01-21 DIAGNOSIS — K219 Gastro-esophageal reflux disease without esophagitis: Secondary | ICD-10-CM | POA: Insufficient documentation

## 2014-01-21 DIAGNOSIS — Z860100 Personal history of colon polyps, unspecified: Secondary | ICD-10-CM | POA: Insufficient documentation

## 2014-01-21 MED ORDER — DEXLANSOPRAZOLE 60 MG PO CPDR: 60.0000 mg | DELAYED_RELEASE_CAPSULE | Freq: Every day | ORAL | Status: DC

## 2014-01-21 MED ORDER — PEG 3350-KCL-NA BICARB-NACL 420 G PO SOLR: 4000.0000 mL | ORAL | Status: DC

## 2014-01-21 NOTE — Progress Notes (Signed)
Primary Care Physician:  Purvis Kilts, MD Primary Gastroenterologist:  Dr. Gala Romney   Chief Complaint  Patient presents with  . Colonoscopy  . Gastrophageal Reflux  . EGD    HPI:   Brooke Herring presents today at the request of Dr. Hilma Favors to schedule surveillance colonoscopy. Last colonoscopy 3 years ago at [facility] in [place]. History of failed sedation in the past. Intermittent low-volume hematochezia. Has hemorrhoids. Tries to eat a high fiber diet. Preparation H as needed. If splurges and eats something fried will have diarrhea. No straining. Occasional abdominal pain. Severe GERD. Worse in the mornings. Sometimes gags while brushing her teeth. Takes Zantac at bedtime. Has tried multiple different PPIs and has to keep switching as it loses effect. No N/V. No dysphagia. Last EGD about 3 years ago. Told she had severe acid reflux. Worsening.   Operative reports not available at time of appt. Patient desires EGD at time of colonoscopy.   Past Medical History  Diagnosis Date  . Unspecified hypothyroidism   . Obesity, unspecified   . Other malaise and fatigue   . OSA (obstructive sleep apnea) 04/23/2013    no cpap    Past Surgical History  Procedure Laterality Date  . Abdominal hysterectomy    . Tonsillectomy      Current Outpatient Prescriptions  Medication Sig Dispense Refill  . aspirin 81 MG tablet Take 81 mg by mouth daily.      Marland Kitchen levothyroxine (SYNTHROID, LEVOTHROID) 75 MCG tablet Take 75 mcg by mouth daily before breakfast.      . Multiple Vitamin (MULTIVITAMIN) tablet Take 1 tablet by mouth daily.      . ranitidine (ZANTAC) 150 MG tablet Take 150 mg by mouth at bedtime.      . meclizine (ANTIVERT) 25 MG tablet Take 1 tablet (25 mg total) by mouth 3 (three) times daily as needed for dizziness.  30 tablet  0  . ondansetron (ZOFRAN) 4 MG tablet Take 1 tablet (4 mg total) by mouth every 6 (six) hours as needed for nausea.  12 tablet  0   No current  facility-administered medications for this visit.    Allergies as of 01/21/2014 - Review Complete 01/21/2014  Allergen Reaction Noted  . Azithromycin Other (See Comments) 04/23/2013    Family History  Problem Relation Age of Onset  . Colon cancer Neg Hx     History   Social History  . Marital Status: Married    Spouse Name: N/A    Number of Children: N/A  . Years of Education: N/A   Occupational History  . housewife    Social History Main Topics  . Smoking status: Former Smoker    Quit date: 04/24/2011  . Smokeless tobacco: Not on file  . Alcohol Use: Yes     Comment: occasional glass of wine  . Drug Use: No  . Sexual Activity: Not on file   Other Topics Concern  . Not on file   Social History Narrative  . No narrative on file    Review of Systems: As mentioned in HPI.   Physical Exam: BP 130/70  Pulse 75  Temp(Src) 98 F (36.7 C) (Oral)  Ht 5\' 3"  (1.6 m)  Wt 202 lb 9.6 oz (91.899 kg)  BMI 35.90 kg/m2 General:   Alert and oriented. Pleasant and cooperative. Well-nourished and well-developed.  Head:  Normocephalic and atraumatic. Eyes:  Without icterus, sclera clear and conjunctiva pink.  Ears:  Normal auditory  acuity. Nose:  No deformity, discharge,  or lesions. Mouth:  No deformity or lesions, oral mucosa pink.  Neck:  Supple, without mass or thyromegaly. Lungs:  Clear to auscultation bilaterally. No wheezes, rales, or rhonchi. No distress.  Heart:  S1, S2 present without murmurs appreciated.  Abdomen:  +BS, soft, non-tender and non-distended. No HSM noted. No guarding or rebound. No masses appreciated.  Rectal:  Deferred  Msk:  Symmetrical without gross deformities. Normal posture. Extremities:  Without clubbing or edema. Neurologic:  Alert and  oriented x4;  grossly normal neurologically. Skin:  Intact without significant lesions or rashes. Cervical Nodes:  No significant cervical adenopathy. Psych:  Alert and cooperative. Normal mood and  affect.

## 2014-01-21 NOTE — Patient Instructions (Signed)
I have provided samples of Dexilant to start taking once daily at lunch. I sent the prescription to the pharmacy in case you would like to continue this.   We have scheduled you for a colonoscopy and upper endoscopy with Dr. Gala Romney in the near future!

## 2014-01-26 NOTE — Assessment & Plan Note (Signed)
58 year old female with personal history of colon polyps, reporting need for surveillance at this time, stating she has been told to complete every 3 years. Operative notes not available att time of visit. Have requested from [facility]. Low-volume hematochezia likely benign anorectal source.  Proceed with TCS with Dr. Gala Romney in near future: the risks, benefits, and alternatives have been discussed with the patient in detail. The patient states understanding and desires to proceed.

## 2014-01-26 NOTE — Assessment & Plan Note (Signed)
Uncontrolled GERD, failing multiple PPIs in the past. Currently not on any PPI but utilizes Zantac in the evenings. GERD worsening but without dysphagia. Desires EGD at time of colonoscopy for evaluation. Although she is without any concerning symptoms and is not on a PPI, she states she would have peace of mind if this is completed. Will trial Dexilant in the interim and proceed with EGD at time of colonoscopy. Obtain any outside reports from [facility].

## 2014-01-29 ENCOUNTER — Encounter (HOSPITAL_COMMUNITY): Payer: Self-pay | Admitting: Pharmacy Technician

## 2014-01-31 NOTE — Progress Notes (Signed)
Cc to pcp °

## 2014-02-11 ENCOUNTER — Encounter: Payer: Self-pay | Admitting: Gastroenterology

## 2014-02-11 NOTE — Progress Notes (Signed)
I received outside records from EGD and colonoscopy.   Last colonoscopy in May 2013 with hyperplastic polyps. SHE HAD INTERNAL HEMORRHOIDS. She is not due for surveillance until 2018.   Last EGD in May 2013 with normal gastric mucosa and abnormal duodenum. PATH without any evidence of H.pylori or other abnormalities.    At this point, we need to cancel the colonoscopy and EGD. It has only been a little over 2 years since her last exam. Likely low-volume hematochezia is secondary to internal hemorrhoids. GERD is severe and worsening, but needs to continue course of Dexilant right now before proceeding with an EGD.

## 2014-02-12 NOTE — Progress Notes (Signed)
Pt is aware that she will not be having her TCS/EGD done. She said that was fine. She also stated that her insurance will not cover the [place]. Is there something different she can try. Please advise

## 2014-02-13 ENCOUNTER — Ambulatory Visit (HOSPITAL_COMMUNITY)
Admission: RE | Admit: 2014-02-13 | Payer: BC Managed Care – PPO | Source: Ambulatory Visit | Admitting: Internal Medicine

## 2014-02-13 ENCOUNTER — Encounter (HOSPITAL_COMMUNITY): Admission: RE | Payer: Self-pay | Source: Ambulatory Visit

## 2014-02-13 SURGERY — COLONOSCOPY
Anesthesia: Moderate Sedation

## 2014-02-24 NOTE — Progress Notes (Signed)
Tried/failed multiple PPIs. Let's see if it is a PA issue. If so, provide samples until we can hear back from insurance regarding [place].

## 2014-03-26 NOTE — Progress Notes (Signed)
I have tried multiple time to reach pt to find out what ppi's she has taken in the past. Unable to reach her and no return call. Letter mailed to her.

## 2014-04-02 ENCOUNTER — Telehealth: Payer: Self-pay | Admitting: Internal Medicine

## 2014-04-02 NOTE — Telephone Encounter (Signed)
Patient received letter from [facility] to call. Please call patient around 330pm if possible at [phone]

## 2014-04-11 NOTE — Telephone Encounter (Signed)
Tried to call pt- NA 

## 2014-04-16 NOTE — Telephone Encounter (Signed)
Tried to call- NA, if pt calls when I am not here, I need to know what PPI's she has tried so I can do her prior authorization for dexilant.

## 2014-04-18 NOTE — Telephone Encounter (Signed)
I finally spoke with the pt. She said she has been out of town for several weeks and the only reason they are home now is because pt's husband had a heart attack and has been at [facility] for over a week. She said she has tried, omeprazole, pantoprazole, nexium, zantac, zegerid. Will fill out PA form and send to the insurance. She said dexilant worked very well.

## 2014-04-18 NOTE — Telephone Encounter (Signed)
PA has been sent to the insurance company. 

## 2014-04-21 NOTE — Telephone Encounter (Signed)
rx was approved. Pt is aware. copay card at the front desk for her to pick up.

## 2014-09-24 ENCOUNTER — Other Ambulatory Visit (HOSPITAL_COMMUNITY): Payer: Self-pay | Admitting: Family Medicine

## 2014-09-24 DIAGNOSIS — Z139 Encounter for screening, unspecified: Secondary | ICD-10-CM

## 2014-09-25 ENCOUNTER — Other Ambulatory Visit (HOSPITAL_COMMUNITY): Payer: Self-pay | Admitting: Family Medicine

## 2014-09-25 DIAGNOSIS — Z1231 Encounter for screening mammogram for malignant neoplasm of breast: Secondary | ICD-10-CM

## 2014-09-29 ENCOUNTER — Ambulatory Visit (HOSPITAL_COMMUNITY)
Admission: RE | Admit: 2014-09-29 | Discharge: 2014-09-29 | Disposition: A | Discharge status: Home / Self Care | Payer: 59 | Source: Ambulatory Visit | Attending: Family Medicine | Admitting: Family Medicine

## 2014-09-29 DIAGNOSIS — Z780 Asymptomatic menopausal state: Secondary | ICD-10-CM | POA: Insufficient documentation

## 2014-09-29 DIAGNOSIS — Z139 Encounter for screening, unspecified: Secondary | ICD-10-CM

## 2014-09-29 DIAGNOSIS — R29890 Loss of height: Secondary | ICD-10-CM | POA: Diagnosis not present

## 2014-11-03 ENCOUNTER — Ambulatory Visit (HOSPITAL_COMMUNITY)
Admission: RE | Admit: 2014-11-03 | Discharge: 2014-11-03 | Disposition: A | Discharge status: Home / Self Care | Payer: 59 | Source: Ambulatory Visit | Attending: Family Medicine | Admitting: Family Medicine

## 2014-11-03 DIAGNOSIS — Z1231 Encounter for screening mammogram for malignant neoplasm of breast: Secondary | ICD-10-CM | POA: Diagnosis not present

## 2014-11-05 ENCOUNTER — Other Ambulatory Visit: Payer: Self-pay | Admitting: Family Medicine

## 2014-11-05 DIAGNOSIS — R928 Other abnormal and inconclusive findings on diagnostic imaging of breast: Secondary | ICD-10-CM

## 2014-11-25 ENCOUNTER — Ambulatory Visit (HOSPITAL_COMMUNITY)
Admission: RE | Admit: 2014-11-25 | Discharge: 2014-11-25 | Disposition: A | Discharge status: Home / Self Care | Payer: 59 | Source: Ambulatory Visit | Attending: Family Medicine | Admitting: Family Medicine

## 2014-11-25 ENCOUNTER — Other Ambulatory Visit (HOSPITAL_COMMUNITY): Payer: Self-pay | Admitting: Family Medicine

## 2014-11-25 DIAGNOSIS — R922 Inconclusive mammogram: Secondary | ICD-10-CM

## 2014-11-25 DIAGNOSIS — N6489 Other specified disorders of breast: Secondary | ICD-10-CM | POA: Insufficient documentation

## 2014-11-25 DIAGNOSIS — R928 Other abnormal and inconclusive findings on diagnostic imaging of breast: Secondary | ICD-10-CM

## 2015-10-16 DIAGNOSIS — E063 Autoimmune thyroiditis: Secondary | ICD-10-CM | POA: Diagnosis not present

## 2015-10-16 DIAGNOSIS — Z0001 Encounter for general adult medical examination with abnormal findings: Secondary | ICD-10-CM | POA: Diagnosis not present

## 2015-10-16 DIAGNOSIS — Z6836 Body mass index (BMI) 36.0-36.9, adult: Secondary | ICD-10-CM | POA: Diagnosis not present

## 2015-10-16 DIAGNOSIS — Z1389 Encounter for screening for other disorder: Secondary | ICD-10-CM | POA: Diagnosis not present

## 2015-10-20 DIAGNOSIS — Z1389 Encounter for screening for other disorder: Secondary | ICD-10-CM | POA: Diagnosis not present

## 2015-10-20 DIAGNOSIS — Z0001 Encounter for general adult medical examination with abnormal findings: Secondary | ICD-10-CM | POA: Diagnosis not present

## 2015-10-20 DIAGNOSIS — R739 Hyperglycemia, unspecified: Secondary | ICD-10-CM | POA: Diagnosis not present

## 2015-10-22 ENCOUNTER — Other Ambulatory Visit (HOSPITAL_COMMUNITY): Payer: Self-pay | Admitting: Internal Medicine

## 2015-10-22 DIAGNOSIS — Z1231 Encounter for screening mammogram for malignant neoplasm of breast: Secondary | ICD-10-CM

## 2015-11-26 ENCOUNTER — Ambulatory Visit (HOSPITAL_COMMUNITY)
Admission: RE | Admit: 2015-11-26 | Discharge: 2015-11-26 | Disposition: A | Discharge status: Home / Self Care | Payer: BLUE CROSS/BLUE SHIELD | Source: Ambulatory Visit | Attending: Internal Medicine | Admitting: Internal Medicine

## 2015-11-26 DIAGNOSIS — Z1231 Encounter for screening mammogram for malignant neoplasm of breast: Secondary | ICD-10-CM | POA: Diagnosis not present

## 2016-05-05 DIAGNOSIS — L659 Nonscarring hair loss, unspecified: Secondary | ICD-10-CM | POA: Diagnosis not present

## 2016-05-05 DIAGNOSIS — Z6836 Body mass index (BMI) 36.0-36.9, adult: Secondary | ICD-10-CM | POA: Diagnosis not present

## 2016-05-05 DIAGNOSIS — E6609 Other obesity due to excess calories: Secondary | ICD-10-CM | POA: Diagnosis not present

## 2016-05-05 DIAGNOSIS — Z23 Encounter for immunization: Secondary | ICD-10-CM | POA: Diagnosis not present

## 2016-05-05 DIAGNOSIS — Z1389 Encounter for screening for other disorder: Secondary | ICD-10-CM | POA: Diagnosis not present

## 2016-05-18 DIAGNOSIS — L650 Telogen effluvium: Secondary | ICD-10-CM | POA: Diagnosis not present

## 2016-06-28 DIAGNOSIS — E039 Hypothyroidism, unspecified: Secondary | ICD-10-CM | POA: Diagnosis not present

## 2016-06-28 DIAGNOSIS — Z6836 Body mass index (BMI) 36.0-36.9, adult: Secondary | ICD-10-CM | POA: Diagnosis not present

## 2016-06-28 DIAGNOSIS — D869 Sarcoidosis, unspecified: Secondary | ICD-10-CM | POA: Diagnosis not present

## 2016-06-28 DIAGNOSIS — A09 Infectious gastroenteritis and colitis, unspecified: Secondary | ICD-10-CM | POA: Diagnosis not present

## 2016-06-28 DIAGNOSIS — Z1389 Encounter for screening for other disorder: Secondary | ICD-10-CM | POA: Diagnosis not present

## 2016-06-30 DIAGNOSIS — Z1389 Encounter for screening for other disorder: Secondary | ICD-10-CM | POA: Diagnosis not present

## 2016-06-30 DIAGNOSIS — K219 Gastro-esophageal reflux disease without esophagitis: Secondary | ICD-10-CM | POA: Diagnosis not present

## 2016-06-30 DIAGNOSIS — E6609 Other obesity due to excess calories: Secondary | ICD-10-CM | POA: Diagnosis not present

## 2016-06-30 DIAGNOSIS — Z6836 Body mass index (BMI) 36.0-36.9, adult: Secondary | ICD-10-CM | POA: Diagnosis not present

## 2016-08-11 DIAGNOSIS — J111 Influenza due to unidentified influenza virus with other respiratory manifestations: Secondary | ICD-10-CM | POA: Diagnosis not present

## 2016-08-11 DIAGNOSIS — Z1389 Encounter for screening for other disorder: Secondary | ICD-10-CM | POA: Diagnosis not present

## 2016-08-11 DIAGNOSIS — Z681 Body mass index (BMI) 19 or less, adult: Secondary | ICD-10-CM | POA: Diagnosis not present

## 2016-10-27 ENCOUNTER — Other Ambulatory Visit (HOSPITAL_COMMUNITY): Payer: Self-pay | Admitting: Family Medicine

## 2016-10-27 DIAGNOSIS — Z1231 Encounter for screening mammogram for malignant neoplasm of breast: Secondary | ICD-10-CM

## 2016-11-28 ENCOUNTER — Ambulatory Visit (HOSPITAL_COMMUNITY)
Admission: RE | Admit: 2016-11-28 | Discharge: 2016-11-28 | Disposition: A | Discharge status: Home / Self Care | Payer: BLUE CROSS/BLUE SHIELD | Source: Ambulatory Visit | Attending: Family Medicine | Admitting: Family Medicine

## 2016-11-28 DIAGNOSIS — Z1231 Encounter for screening mammogram for malignant neoplasm of breast: Secondary | ICD-10-CM | POA: Diagnosis not present

## 2016-12-23 DIAGNOSIS — Z6836 Body mass index (BMI) 36.0-36.9, adult: Secondary | ICD-10-CM | POA: Diagnosis not present

## 2016-12-23 DIAGNOSIS — R221 Localized swelling, mass and lump, neck: Secondary | ICD-10-CM | POA: Diagnosis not present

## 2016-12-23 DIAGNOSIS — E063 Autoimmune thyroiditis: Secondary | ICD-10-CM | POA: Diagnosis not present

## 2016-12-23 DIAGNOSIS — Z0001 Encounter for general adult medical examination with abnormal findings: Secondary | ICD-10-CM | POA: Diagnosis not present

## 2016-12-23 DIAGNOSIS — Z1389 Encounter for screening for other disorder: Secondary | ICD-10-CM | POA: Diagnosis not present

## 2016-12-28 DIAGNOSIS — R221 Localized swelling, mass and lump, neck: Secondary | ICD-10-CM | POA: Diagnosis not present

## 2016-12-28 DIAGNOSIS — Z1389 Encounter for screening for other disorder: Secondary | ICD-10-CM | POA: Diagnosis not present

## 2016-12-28 DIAGNOSIS — Z0001 Encounter for general adult medical examination with abnormal findings: Secondary | ICD-10-CM | POA: Diagnosis not present

## 2016-12-30 DIAGNOSIS — E063 Autoimmune thyroiditis: Secondary | ICD-10-CM | POA: Diagnosis not present

## 2017-01-02 DIAGNOSIS — Z0000 Encounter for general adult medical examination without abnormal findings: Secondary | ICD-10-CM | POA: Diagnosis not present

## 2017-01-02 DIAGNOSIS — R220 Localized swelling, mass and lump, head: Secondary | ICD-10-CM | POA: Diagnosis not present

## 2017-01-02 DIAGNOSIS — Z0001 Encounter for general adult medical examination with abnormal findings: Secondary | ICD-10-CM | POA: Diagnosis not present

## 2017-01-02 DIAGNOSIS — R221 Localized swelling, mass and lump, neck: Secondary | ICD-10-CM | POA: Diagnosis not present

## 2017-02-20 ENCOUNTER — Other Ambulatory Visit (INDEPENDENT_AMBULATORY_CARE_PROVIDER_SITE_OTHER): Payer: Self-pay | Admitting: Otolaryngology

## 2017-02-20 ENCOUNTER — Ambulatory Visit (INDEPENDENT_AMBULATORY_CARE_PROVIDER_SITE_OTHER): Payer: BLUE CROSS/BLUE SHIELD | Admitting: Otolaryngology

## 2017-02-20 DIAGNOSIS — D37032 Neoplasm of uncertain behavior of the submandibular salivary glands: Secondary | ICD-10-CM

## 2017-02-20 DIAGNOSIS — R221 Localized swelling, mass and lump, neck: Secondary | ICD-10-CM

## 2017-03-02 ENCOUNTER — Ambulatory Visit
Admission: RE | Admit: 2017-03-02 | Discharge: 2017-03-02 | Disposition: A | Discharge status: Home / Self Care | Payer: BLUE CROSS/BLUE SHIELD | Source: Ambulatory Visit | Attending: Otolaryngology | Admitting: Otolaryngology

## 2017-03-02 DIAGNOSIS — R221 Localized swelling, mass and lump, neck: Secondary | ICD-10-CM | POA: Diagnosis not present

## 2017-03-02 MED ORDER — IOPAMIDOL (ISOVUE-300) INJECTION 61%
75.0000 mL | Freq: Once | INTRAVENOUS | Status: AC | PRN
  Administered 2017-03-02: 75 mL via INTRAVENOUS

## 2017-03-16 ENCOUNTER — Ambulatory Visit (INDEPENDENT_AMBULATORY_CARE_PROVIDER_SITE_OTHER): Payer: BLUE CROSS/BLUE SHIELD | Admitting: Otolaryngology

## 2017-03-16 DIAGNOSIS — D37030 Neoplasm of uncertain behavior of the parotid salivary glands: Secondary | ICD-10-CM | POA: Diagnosis not present

## 2017-06-01 ENCOUNTER — Other Ambulatory Visit: Payer: Self-pay | Admitting: Otolaryngology

## 2017-06-06 ENCOUNTER — Ambulatory Visit (HOSPITAL_BASED_OUTPATIENT_CLINIC_OR_DEPARTMENT_OTHER): Admit: 2017-06-06 | Payer: BLUE CROSS/BLUE SHIELD | Admitting: Otolaryngology

## 2017-06-06 ENCOUNTER — Encounter (HOSPITAL_BASED_OUTPATIENT_CLINIC_OR_DEPARTMENT_OTHER): Payer: Self-pay

## 2017-06-06 SURGERY — EXCISION, PAROTID GLAND
Anesthesia: General | Laterality: Left

## 2017-11-15 ENCOUNTER — Other Ambulatory Visit (HOSPITAL_COMMUNITY): Payer: Self-pay | Admitting: Internal Medicine

## 2017-11-15 DIAGNOSIS — Z1231 Encounter for screening mammogram for malignant neoplasm of breast: Secondary | ICD-10-CM

## 2017-12-04 ENCOUNTER — Ambulatory Visit (HOSPITAL_COMMUNITY)
Admission: RE | Admit: 2017-12-04 | Discharge: 2017-12-04 | Disposition: A | Discharge status: Home / Self Care | Payer: BLUE CROSS/BLUE SHIELD | Source: Ambulatory Visit | Attending: Internal Medicine | Admitting: Internal Medicine

## 2017-12-04 DIAGNOSIS — Z1231 Encounter for screening mammogram for malignant neoplasm of breast: Secondary | ICD-10-CM

## 2017-12-29 DIAGNOSIS — D869 Sarcoidosis, unspecified: Secondary | ICD-10-CM | POA: Diagnosis not present

## 2017-12-29 DIAGNOSIS — E063 Autoimmune thyroiditis: Secondary | ICD-10-CM | POA: Diagnosis not present

## 2017-12-29 DIAGNOSIS — K449 Diaphragmatic hernia without obstruction or gangrene: Secondary | ICD-10-CM | POA: Diagnosis not present

## 2017-12-29 DIAGNOSIS — Z6833 Body mass index (BMI) 33.0-33.9, adult: Secondary | ICD-10-CM | POA: Diagnosis not present

## 2017-12-29 DIAGNOSIS — Z0001 Encounter for general adult medical examination with abnormal findings: Secondary | ICD-10-CM | POA: Diagnosis not present

## 2017-12-29 DIAGNOSIS — Z1389 Encounter for screening for other disorder: Secondary | ICD-10-CM | POA: Diagnosis not present

## 2017-12-29 DIAGNOSIS — E6609 Other obesity due to excess calories: Secondary | ICD-10-CM | POA: Diagnosis not present

## 2017-12-29 DIAGNOSIS — K219 Gastro-esophageal reflux disease without esophagitis: Secondary | ICD-10-CM | POA: Diagnosis not present

## 2017-12-29 DIAGNOSIS — N189 Chronic kidney disease, unspecified: Secondary | ICD-10-CM | POA: Diagnosis not present

## 2018-01-16 DIAGNOSIS — E063 Autoimmune thyroiditis: Secondary | ICD-10-CM | POA: Diagnosis not present

## 2018-10-29 ENCOUNTER — Other Ambulatory Visit (HOSPITAL_COMMUNITY): Payer: Self-pay | Admitting: Internal Medicine

## 2018-10-29 DIAGNOSIS — Z1231 Encounter for screening mammogram for malignant neoplasm of breast: Secondary | ICD-10-CM

## 2018-12-07 ENCOUNTER — Ambulatory Visit (HOSPITAL_COMMUNITY)
Admission: RE | Admit: 2018-12-07 | Discharge: 2018-12-07 | Disposition: A | Discharge status: Home / Self Care | Payer: BC Managed Care – PPO | Source: Ambulatory Visit | Attending: Internal Medicine | Admitting: Internal Medicine

## 2018-12-07 ENCOUNTER — Other Ambulatory Visit: Payer: Self-pay

## 2018-12-07 DIAGNOSIS — Z1231 Encounter for screening mammogram for malignant neoplasm of breast: Secondary | ICD-10-CM | POA: Diagnosis not present

## 2019-01-03 DIAGNOSIS — E063 Autoimmune thyroiditis: Secondary | ICD-10-CM | POA: Diagnosis not present

## 2019-01-03 DIAGNOSIS — Z0001 Encounter for general adult medical examination with abnormal findings: Secondary | ICD-10-CM | POA: Diagnosis not present

## 2019-01-03 DIAGNOSIS — D869 Sarcoidosis, unspecified: Secondary | ICD-10-CM | POA: Diagnosis not present

## 2019-01-03 DIAGNOSIS — K219 Gastro-esophageal reflux disease without esophagitis: Secondary | ICD-10-CM | POA: Diagnosis not present

## 2019-01-03 DIAGNOSIS — Z6833 Body mass index (BMI) 33.0-33.9, adult: Secondary | ICD-10-CM | POA: Diagnosis not present

## 2019-01-03 DIAGNOSIS — Z1389 Encounter for screening for other disorder: Secondary | ICD-10-CM | POA: Diagnosis not present

## 2019-04-08 DIAGNOSIS — Z20828 Contact with and (suspected) exposure to other viral communicable diseases: Secondary | ICD-10-CM | POA: Diagnosis not present

## 2019-04-09 DIAGNOSIS — M2392 Unspecified internal derangement of left knee: Secondary | ICD-10-CM | POA: Diagnosis not present

## 2020-01-10 ENCOUNTER — Other Ambulatory Visit (HOSPITAL_COMMUNITY): Payer: Self-pay | Admitting: Internal Medicine

## 2020-01-10 DIAGNOSIS — Z1231 Encounter for screening mammogram for malignant neoplasm of breast: Secondary | ICD-10-CM

## 2020-01-10 DIAGNOSIS — E2839 Other primary ovarian failure: Secondary | ICD-10-CM

## 2020-01-22 ENCOUNTER — Encounter: Payer: Self-pay | Admitting: Internal Medicine

## 2020-01-23 ENCOUNTER — Other Ambulatory Visit: Payer: Self-pay

## 2020-01-23 ENCOUNTER — Ambulatory Visit (HOSPITAL_COMMUNITY)
Admission: RE | Admit: 2020-01-23 | Discharge: 2020-01-23 | Disposition: A | Discharge status: Home / Self Care | Payer: Medicare PPO | Source: Ambulatory Visit | Attending: Internal Medicine | Admitting: Internal Medicine

## 2020-01-23 DIAGNOSIS — Z1231 Encounter for screening mammogram for malignant neoplasm of breast: Secondary | ICD-10-CM | POA: Insufficient documentation

## 2020-01-23 DIAGNOSIS — Z13820 Encounter for screening for osteoporosis: Secondary | ICD-10-CM | POA: Insufficient documentation

## 2020-01-23 DIAGNOSIS — E2839 Other primary ovarian failure: Secondary | ICD-10-CM | POA: Diagnosis present

## 2020-01-23 DIAGNOSIS — Z780 Asymptomatic menopausal state: Secondary | ICD-10-CM | POA: Diagnosis not present

## 2020-03-21 NOTE — Progress Notes (Deleted)
Referring Provider: Redmond School, MD Primary Care Physician:  Sharilyn Sites, MD Primary Gastroenterologist:  Dr. Rayne Du chief complaint on file.   HPI:   TARRAH Herring is a 64 y.o. female presenting today at the request of Redmond School, MD for consult colonoscopy.  Recommend office visit due to failed sedation with last procedure. History of GERD and colon polyps. Last seen in our office August 2015.  Thought she was due for colonoscopy at that time; however, we requested records from [facility].  Per review of telephone note 01/21/2014, last colonoscopy was in May 2013 hyperplastic polyps.  Also with internal hemorrhoids.  Due for surveillance in 2018.  She had reported low-volume hematochezia which was suspected to be secondary to internal hemorrhoids.  She also complained of severe GERD failing multiple PPIs.  Plan to trial Dexilant.   Today: Last colonoscopy:  GERD:   Past Medical History:  Diagnosis Date   Obesity, unspecified    OSA (obstructive sleep apnea) 04/23/2013   no cpap   Other malaise and fatigue    Unspecified hypothyroidism     Past Surgical History:  Procedure Laterality Date   ABDOMINAL HYSTERECTOMY     COLONOSCOPY  May 2013   Dr. Michail Sermon: hyperplastic polyps, internal hemorrhoids   ESOPHAGOGASTRODUODENOSCOPY  May 2013   Dr. Michail Sermon: normal esophagus, normal stomach, mucosa abnormality of duodenum but path negative. Negative H.pylori   TONSILLECTOMY      Current Outpatient Medications  Medication Sig Dispense Refill   aspirin 81 MG tablet Take 81 mg by mouth daily.     dexlansoprazole (DEXILANT) 60 MG capsule Take 1 capsule (60 mg total) by mouth daily. 30 capsule 3   levothyroxine (SYNTHROID, LEVOTHROID) 75 MCG tablet Take 75 mcg by mouth daily before breakfast.     Multiple Vitamin (MULTIVITAMIN) tablet Take 1 tablet by mouth daily.     ranitidine (ZANTAC) 150 MG tablet Take 150 mg by mouth at bedtime.     No current  facility-administered medications for this visit.    Allergies as of 03/23/2020 - Review Complete 03/02/2017  Allergen Reaction Noted   Azithromycin Other (See Comments) 04/23/2013    Family History  Problem Relation Age of Onset   Colon cancer Neg Hx     Social History   Socioeconomic History   Marital status: Married    Spouse name: Not on file   Number of children: Not on file   Years of education: Not on file   Highest education level: Not on file  Occupational History   Occupation: housewife  Tobacco Use   Smoking status: Former Smoker    Quit date: 04/24/2011    Years since quitting: 8.9  Substance and Sexual Activity   Alcohol use: Yes    Comment: occasional glass of wine   Drug use: No   Sexual activity: Not on file  Other Topics Concern   Not on file  Social History Narrative   Not on file   Social Determinants of Health   Financial Resource Strain:    Difficulty of Paying Living Expenses: Not on file  Food Insecurity:    Worried About Charity fundraiser in the Last Year: Not on file   [place] in the Last Year: Not on file  Transportation Needs:    Lack of Transportation (Medical): Not on file   Lack of Transportation (Non-Medical): Not on file  Physical Activity:    Days of Exercise per Week: Not  on file   Minutes of Exercise per Session: Not on file  Stress:    Feeling of Stress : Not on file  Social Connections:    Frequency of Communication with Friends and Family: Not on file   Frequency of Social Gatherings with Friends and Family: Not on file   Attends Religious Services: Not on file   Active Member of Clubs or Organizations: Not on file   Attends Archivist Meetings: Not on file   Marital Status: Not on file  Intimate Partner Violence:    Fear of Current or Ex-Partner: Not on file   Emotionally Abused: Not on file   Physically Abused: Not on file   Sexually Abused: Not on file     Review of Systems: Gen: Denies any fever, chills, fatigue, weight loss, lack of appetite.  CV: Denies chest pain, heart palpitations, peripheral edema, syncope.  Resp: Denies shortness of breath at rest or with exertion. Denies wheezing or cough.  GI: Denies dysphagia or odynophagia. Denies jaundice, hematemesis, fecal incontinence. GU : Denies urinary burning, urinary frequency, urinary hesitancy MS: Denies joint pain, muscle weakness, cramps, or limitation of movement.  Derm: Denies rash, itching, dry skin Psych: Denies depression, anxiety, memory loss, and confusion Heme: Denies bruising, bleeding, and enlarged lymph nodes.  Physical Exam: There were no vitals taken for this visit. General:   Alert and oriented. Pleasant and cooperative. Well-nourished and well-developed.  Head:  Normocephalic and atraumatic. Eyes:  Without icterus, sclera clear and conjunctiva pink.  Ears:  Normal auditory acuity. Nose:  No deformity, discharge,  or lesions. Mouth:  No deformity or lesions, oral mucosa pink.  Neck:  Supple, without mass or thyromegaly. Lungs:  Clear to auscultation bilaterally. No wheezes, rales, or rhonchi. No distress.  Heart:  S1, S2 present without murmurs appreciated.  Abdomen:  +BS, soft, non-tender and non-distended. No HSM noted. No guarding or rebound. No masses appreciated.  Rectal:  Deferred  Msk:  Symmetrical without gross deformities. Normal posture. Pulses:  Normal pulses noted. Extremities:  Without clubbing or edema. Neurologic:  Alert and  oriented x4;  grossly normal neurologically. Skin:  Intact without significant lesions or rashes. Cervical Nodes:  No significant cervical adenopathy. Psych:  Alert and cooperative. Normal mood and affect.

## 2020-03-23 ENCOUNTER — Ambulatory Visit: Payer: Medicare PPO | Admitting: Gastroenterology

## 2020-05-02 NOTE — Progress Notes (Signed)
Referring Provider: Redmond School, MD Primary Care Physician:  Sharilyn Sites, MD Primary Gastroenterologist:  Dr. Gala Romney  Chief Complaint  Patient presents with  . Consult    due for TCS    HPI:   Brooke Herring is a 64 y.o. female presenting today at the request of Redmond School, MD for consult colonoscopy.   History of GERD failing multiple PPIs (omeprazole, pantoprazole, nexium) but reponded well to Dexilant in 2015, intermittent low volume hematochezia in the setting of hemorrhoids, failed sedation in the past. According to notes from prior visit in 2015, last colonoscopy in May 2013  with [facility] repealing hyperplastic polyps, internal hemorrhoids, with recommendations to repeat in 2018. EGD in May 2013 with normal gastric mucosa and abnormal duodenum. Path without any evidence of H. Pylori or other abnormalities.   Today:  Occasional brbpr. Blood can been on toilet tissue, stools, and in toilet water. Known hemorrhoids that are on the outside. States she never knows when she will "set them off". Can be 3-4 times a year to a couple times a month. Occasional constipation. Taking dulcolax stool softener about once a month as needed. Occasional diarrhea triggered by [place] foods and spicy foods.  No trouble with dairy products. On days she has loose stools, she can have up to 4 BMs daily.  No routine nocturnal BMs. May occur rarely depending on what she has eaten. Alternating constipation and diarrhea are chronic. No black stools. No family history of colon cancer.   Chronic history of GERD. States she had GERD in 2013 when she had last EGD. GERD became well controlled after she had weight loss and she did not require a PPI.  She was only taking H2 blocker as needed. Gained weight back about 2-3 years ago, and GERD symptoms resumed. Started on Protonix 20 mg daily at that point.  Does not remember Dexilant.  With Protonix 20 mg daily, she continues to have GERD symptoms daily, worse at  night.  No fried/fatty foods. Eats some spicy foods and [place] 2 weeks.  States she wants them but tries to limit them. Rare soda. Drinks a lot of tea and coffee. No dysphagia. Occasional nausea with vomiting if reflux is severe. Rare. No abdominal pain.   No NSAIDs.   Elevated ALT at 36 August 2021.  Denies alcohol use, illicit drug use, Tylenol, OTC supplements, herbal teas, tattoos, blood transfusions, or known exposure to hepatitis.  No swelling in abdomen or lower extremities, confusion, yellowing of eyes.  Past Medical History:  Diagnosis Date  . GERD (gastroesophageal reflux disease)   . Hypothyroidism   . Obesity, unspecified   . OSA (obstructive sleep apnea) 04/23/2013   no cpap  . Other malaise and fatigue   . Sarcoidosis   . Unspecified hypothyroidism     Past Surgical History:  Procedure Laterality Date  . ABDOMINAL HYSTERECTOMY    . COLONOSCOPY  May 2013   Dr. Michail Sermon: hyperplastic polyps, internal hemorrhoids  . ESOPHAGOGASTRODUODENOSCOPY  May 2013   Dr. Michail Sermon: normal esophagus, normal stomach, mucosa abnormality of duodenum but path negative. Negative H.pylori  . TONSILLECTOMY      Current Outpatient Medications  Medication Sig Dispense Refill  . levothyroxine (SYNTHROID) 88 MCG tablet Take 75 mcg by mouth daily before breakfast.     . loratadine (CLARITIN) 10 MG tablet Take 10 mg by mouth daily.    . pantoprazole (PROTONIX) 20 MG tablet Take 20 mg by mouth daily.     No  current facility-administered medications for this visit.    Allergies as of 05/04/2020 - Review Complete 05/04/2020  Allergen Reaction Noted  . Azithromycin Other (See Comments) 04/23/2013    Family History  Problem Relation Age of Onset  . Colon cancer Neg Hx     Social History   Socioeconomic History  . Marital status: Married    Spouse name: Not on file  . Number of children: Not on file  . Years of education: Not on file  . Highest education level: Not on file   Occupational History  . Occupation: housewife  Tobacco Use  . Smoking status: Former Smoker    Quit date: 04/24/2011    Years since quitting: 9.0  . Smokeless tobacco: Never Used  Substance and Sexual Activity  . Alcohol use: Not Currently  . Drug use: No  . Sexual activity: Not on file  Other Topics Concern  . Not on file  Social History Narrative  . Not on file   Social Determinants of Health   Financial Resource Strain:   . Difficulty of Paying Living Expenses: Not on file  Food Insecurity:   . Worried About Charity fundraiser in the Last Year: Not on file  . Ran Out of Food in the Last Year: Not on file  Transportation Needs:   . Lack of Transportation (Medical): Not on file  . Lack of Transportation (Non-Medical): Not on file  Physical Activity:   . Days of Exercise per Week: Not on file  . Minutes of Exercise per Session: Not on file  Stress:   . Feeling of Stress : Not on file  Social Connections:   . Frequency of Communication with Friends and Family: Not on file  . Frequency of Social Gatherings with Friends and Family: Not on file  . Attends Religious Services: Not on file  . Active Member of Clubs or Organizations: Not on file  . Attends Archivist Meetings: Not on file  . Marital Status: Not on file  Intimate Partner Violence:   . Fear of Current or Ex-Partner: Not on file  . Emotionally Abused: Not on file  . Physically Abused: Not on file  . Sexually Abused: Not on file    Review of Systems: Gen: Denies any fever, chills, cold or flulike symptoms, lightheadedness, dizziness, presyncope, syncope. CV: Denies chest pain or palpitations. Resp: Denies shortness of breath or cough.  GI: See HPI GU : Denies urinary burning, urinary frequency, urinary hesitancy MS: Denies joint pain Derm: Denies rash Psych: Denies depression or anxiety Heme: See HPI  Physical Exam: BP (!) 151/81   Pulse 78   Temp (!) 97.1 F (36.2 C)   Ht '5\' 2"'  (1.575 m)    Wt 208 lb 3.2 oz (94.4 kg)   BMI 38.08 kg/m  General:   Alert and oriented. Pleasant and cooperative. Well-nourished and well-developed.  Head:  Normocephalic and atraumatic. Eyes:  Without icterus, sclera clear and conjunctiva pink.  Ears:  Normal auditory acuity. Lungs:  Clear to auscultation bilaterally. No wheezes, rales, or rhonchi. No distress.  Heart:  S1, S2 present without murmurs appreciated.  Abdomen:  +BS, soft, non-tender and non-distended. No HSM noted. No guarding or rebound. No masses appreciated.  Rectal:  Deferred  Msk:  Symmetrical without gross deformities. Normal posture. Extremities:  Without edema. Neurologic:  Alert and  oriented x4;  grossly normal neurologically. Skin:  Intact without significant lesions or rashes. Psych: Normal mood and affect.  Labs: 01/09/2020  CBC: WBC 5.5, hemoglobin 14.4, hematocrit 46.2, MCV 93, MCH 29.1, MCHC 31.2 (L), platelets 243 CMP: Glucose 101, creatinine 0.91, sodium 141, potassium 4.4, chloride 102, total calcium 10.0, total protein 7.3, albumin 4.6, total bilirubin 0.5, alk phos 73, AST 17, ALT 36 (H) Lipid panel: Cholesterol 223 (H), triglycerides 111, HDL 53, LDL 130 TSH: 1.43 B12: 510 Vitamin D, 25: 33.5

## 2020-05-04 ENCOUNTER — Encounter: Payer: Self-pay | Admitting: Gastroenterology

## 2020-05-04 ENCOUNTER — Ambulatory Visit: Payer: Medicare PPO | Admitting: Gastroenterology

## 2020-05-04 ENCOUNTER — Other Ambulatory Visit: Payer: Self-pay

## 2020-05-04 VITALS — BP 151/81 | HR 78 | Temp 97.1°F | Ht 62.0 in | Wt 208.2 lb

## 2020-05-04 DIAGNOSIS — K219 Gastro-esophageal reflux disease without esophagitis: Secondary | ICD-10-CM

## 2020-05-04 DIAGNOSIS — Z86010 Personal history of colonic polyps: Secondary | ICD-10-CM | POA: Diagnosis not present

## 2020-05-04 DIAGNOSIS — K921 Melena: Secondary | ICD-10-CM | POA: Diagnosis not present

## 2020-05-04 DIAGNOSIS — R7401 Elevation of levels of liver transaminase levels: Secondary | ICD-10-CM | POA: Diagnosis not present

## 2020-05-04 NOTE — Patient Instructions (Addendum)
Please have blood work completed at [facility].  We will get you scheduled for a colonoscopy in the near future with Dr. Gala Romney.  Stop Protonix and start Dexilant 60 mg daily.  We will provide you with samples today.  Take Dexilant 3-4 hours after levothyroxine. Please call with progress report in 1-2 weeks.  If this works well, we will send in a prescription.  Follow a GERD diet:  Avoid fried, fatty, greasy, spicy, citrus foods. Avoid caffeine and carbonated beverages. Avoid chocolate. Try eating 4-6 small meals a day rather than 3 large meals. Do not eat within 3 hours of laying down. Prop head of bed up on wood or bricks to create a 6 inch incline.  For alternating constipation and diarrhea:  Start benefiber 1 tablespoon daily x3 weeks then increase to 2 tablespoons daily thereafter.   Aliene Altes, PA-C [facility]    Food Choices for Gastroesophageal Reflux Disease, Adult When you have gastroesophageal reflux disease (GERD), the foods you eat and your eating habits are very important. Choosing the right foods can help ease your discomfort. Think about working with a nutrition specialist (dietitian) to help you make good choices. What are tips for following this plan?  Meals  Choose healthy foods that are low in fat, such as fruits, vegetables, whole grains, low-fat dairy products, and lean meat, fish, and poultry.  Eat small meals often instead of 3 large meals a day. Eat your meals slowly, and in a place where you are relaxed. Avoid bending over or lying down until 2-3 hours after eating.  Avoid eating meals 2-3 hours before bed.  Avoid drinking a lot of liquid with meals.  Cook foods using methods other than frying. Bake, grill, or broil food instead.  Avoid or limit: ? Chocolate. ? Peppermint or spearmint. ? Alcohol. ? Pepper. ? Black and decaffeinated coffee. ? Black and decaffeinated tea. ? Bubbly (carbonated) soft drinks. ? Caffeinated energy  drinks and soft drinks.  Limit high-fat foods such as: ? Fatty meat or fried foods. ? Whole milk, cream, butter, or ice cream. ? Nuts and nut butters. ? Pastries, donuts, and sweets made with butter or shortening.  Avoid foods that cause symptoms. These foods may be different for everyone. Common foods that cause symptoms include: ? Tomatoes. ? Oranges, lemons, and limes. ? Peppers. ? Spicy food. ? Onions and garlic. ? Vinegar. Lifestyle  Maintain a healthy weight. Ask your doctor what weight is healthy for you. If you need to lose weight, work with your doctor to do so safely.  Exercise for at least 30 minutes for 5 or more days each week, or as told by your doctor.  Wear loose-fitting clothes.  Do not smoke. If you need help quitting, ask your doctor.  Sleep with the head of your bed higher than your feet. Use a wedge under the mattress or blocks under the bed frame to raise the head of the bed. Summary  When you have gastroesophageal reflux disease (GERD), food and lifestyle choices are very important in easing your symptoms.  Eat small meals often instead of 3 large meals a day. Eat your meals slowly, and in a place where you are relaxed.  Limit high-fat foods such as fatty meat or fried foods.  Avoid bending over or lying down until 2-3 hours after eating.  Avoid peppermint and spearmint, caffeine, alcohol, and chocolate. This information is not intended to replace advice given to you by your health care provider. Make sure you  discuss any questions you have with your health care provider. Document Revised: 09/06/2018 Document Reviewed: 06/21/2016 Elsevier Patient Education  [place].

## 2020-05-04 NOTE — Assessment & Plan Note (Signed)
Addressed under history of colonic polyps.

## 2020-05-04 NOTE — Assessment & Plan Note (Addendum)
Slight elevation of ALT at 36 in August 2021.  Patient denies alcohol use, illicit drug use, Tylenol, OTC supplements, herbal teas, tattoos, blood transfusions, or any known exposure to hepatitis.  No signs or symptoms of decompensated liver disease. History of sarcoidosis, obesity, and HLD.   With such mild elevation, we will start with updating HFP first to verify ALT remains elevated.  If so, will obtain RUQ ultrasound and labs to evaluate for hepatitis and hemochromatosis.

## 2020-05-04 NOTE — Progress Notes (Signed)
CC'ED TO PCP 

## 2020-05-04 NOTE — Patient Instructions (Signed)
PA for TCS submitted via [employer] website. Case approved. [employer]# [id number], valid 06/25/20-07/25/20.

## 2020-05-04 NOTE — Assessment & Plan Note (Addendum)
Chronic history of GERD not adequately controlled.  Previously failed multiple PPIs including omeprazole, pantoprazole, and Nexium.  She responded well to [place] in 2015.  Subsequently, she lost weight and no longer needed PPI.  About 2-3 years ago, she began to gain weight again and had return of GERD symptoms.  Currently on Protonix 20 mg daily, but she continues with GERD symptoms daily.  Denies dysphagia, abdominal pain, or melena.   Plan: Stop Protonix and retry Dexilant 60 mg daily.  Samples provided.  Requested progress report in 1-2 weeks. Advised to take Dexilant at least 3-4 hours after levothyroxine. Counseled on GERD diet/lifestyle.  Handout provided.

## 2020-05-04 NOTE — Assessment & Plan Note (Addendum)
64 year old female with history of colon polyps currently overdue for surveillance colonoscopy.  Last colonoscopy in 2013 at [facility] revealing hyperplastic polyps, internal hemorrhoids, recommended repeat in 2018.  She has chronic history of intermittent low volume hematochezia likely secondary to known hemorrhoids. No unintentional weight loss.  Hemoglobin 14.4 in August 2021.  No family history of colon cancer.   Also with chronic alternating constipation and diarrhea which is influenced by dietary habits.    Notably, patient has history of failed sedation.  Plan:  Proceed with colonoscopy with propofol with Dr. Gala Romney in the near future. The risks, benefits, and alternatives have been discussed with the patient in detail. The patient states understanding and desires to proceed.  ASA II Start benefiber 1 tablespoon daily x3 weeks, then increase to 2 tablespoons daily thereafter.  Follow-up after procedure.

## 2020-05-12 ENCOUNTER — Other Ambulatory Visit: Payer: Self-pay | Admitting: Gastroenterology

## 2020-05-12 ENCOUNTER — Telehealth: Payer: Self-pay

## 2020-05-12 DIAGNOSIS — K219 Gastro-esophageal reflux disease without esophagitis: Secondary | ICD-10-CM

## 2020-05-12 MED ORDER — DEXILANT 60 MG PO CPDR: 60.0000 mg | DELAYED_RELEASE_CAPSULE | Freq: Every day | ORAL | 5 refills | Status: DC

## 2020-05-12 NOTE — Telephone Encounter (Signed)
Noted  

## 2020-05-12 NOTE — Telephone Encounter (Signed)
Rx sent to pharmacy   

## 2020-05-12 NOTE — Telephone Encounter (Signed)
Pt called with a progress report for Dexilant 60 mg. Per pt, Dexilant 60 mg is working well and she would like RX sent into her pharmacy.

## 2020-05-15 ENCOUNTER — Other Ambulatory Visit: Payer: Self-pay | Admitting: Gastroenterology

## 2020-05-15 ENCOUNTER — Telehealth: Payer: Self-pay | Admitting: Internal Medicine

## 2020-05-15 DIAGNOSIS — K219 Gastro-esophageal reflux disease without esophagitis: Secondary | ICD-10-CM

## 2020-05-15 MED ORDER — PANTOPRAZOLE SODIUM 40 MG PO TBEC: 40.0000 mg | DELAYED_RELEASE_TABLET | Freq: Every day | ORAL | 2 refills | Status: DC

## 2020-05-15 NOTE — Telephone Encounter (Signed)
Noted. Spoke with pt. Pt was notified of that Pantoprazole 40 mg was sent into her pharmacy. Pt will call back if it isn't working.

## 2020-05-15 NOTE — Telephone Encounter (Signed)
Pt said that she can not afford to get Dexilant 60mg  and was there anything else she could try that wasn't so expensive? She uses [facility]. Please advise. [phone]

## 2020-05-15 NOTE — Telephone Encounter (Signed)
Dexilant 60 mg is approved through pts insurance but the cost is $130 for pt. Pt would like to try another medication due to the cost. Pt has previously tried a low dose of Pantoprazole once daily. Dexilant is a higher tier. Omeprazole, pantoprazole esomeprazole are lower tier medications.

## 2020-05-15 NOTE — Telephone Encounter (Signed)
We can try her on Protonix 40 mg daily. I will send a Rx to her pharmacy. Please have her call in 2-4 weeks if Protonix isn't working well.

## 2020-05-20 LAB — HEPATIC FUNCTION PANEL
ALT: 16 IU/L (ref 0–32)
AST: 16 IU/L (ref 0–40)
Albumin: 4.4 g/dL (ref 3.8–4.8)
Alkaline Phosphatase: 63 IU/L (ref 44–121)
Bilirubin Total: 0.5 mg/dL (ref 0.0–1.2)
Bilirubin, Direct: 0.12 mg/dL (ref 0.00–0.40)
Total Protein: 6.8 g/dL (ref 6.0–8.5)

## 2020-06-23 ENCOUNTER — Other Ambulatory Visit (HOSPITAL_COMMUNITY)
Admission: RE | Admit: 2020-06-23 | Discharge: 2020-06-23 | Disposition: A | Discharge status: Home / Self Care | Payer: Medicare PPO | Source: Ambulatory Visit | Attending: Internal Medicine | Admitting: Internal Medicine

## 2020-06-23 ENCOUNTER — Other Ambulatory Visit: Payer: Self-pay

## 2020-06-23 DIAGNOSIS — Z01812 Encounter for preprocedural laboratory examination: Secondary | ICD-10-CM | POA: Diagnosis present

## 2020-06-23 DIAGNOSIS — Z20822 Contact with and (suspected) exposure to covid-19: Secondary | ICD-10-CM | POA: Insufficient documentation

## 2020-06-23 LAB — SARS CORONAVIRUS 2 (TAT 6-24 HRS): SARS Coronavirus 2: NEGATIVE

## 2020-06-25 ENCOUNTER — Other Ambulatory Visit: Payer: Self-pay

## 2020-06-25 ENCOUNTER — Ambulatory Visit (HOSPITAL_COMMUNITY)
Admission: RE | Admit: 2020-06-25 | Discharge: 2020-06-25 | Disposition: A | Discharge status: Home / Self Care | Payer: Medicare PPO | Attending: Internal Medicine | Admitting: Internal Medicine

## 2020-06-25 ENCOUNTER — Encounter (HOSPITAL_COMMUNITY)
Admission: RE | Disposition: A | Discharge status: Home / Self Care | Payer: Self-pay | Source: Home / Self Care | Attending: Internal Medicine

## 2020-06-25 ENCOUNTER — Ambulatory Visit (HOSPITAL_COMMUNITY): Payer: Medicare PPO | Admitting: Certified Registered Nurse Anesthetist

## 2020-06-25 ENCOUNTER — Encounter (HOSPITAL_COMMUNITY): Payer: Self-pay | Admitting: Internal Medicine

## 2020-06-25 DIAGNOSIS — K641 Second degree hemorrhoids: Secondary | ICD-10-CM | POA: Insufficient documentation

## 2020-06-25 DIAGNOSIS — Z79890 Hormone replacement therapy (postmenopausal): Secondary | ICD-10-CM | POA: Insufficient documentation

## 2020-06-25 DIAGNOSIS — Z86010 Personal history of colonic polyps: Secondary | ICD-10-CM | POA: Diagnosis not present

## 2020-06-25 DIAGNOSIS — Z79899 Other long term (current) drug therapy: Secondary | ICD-10-CM | POA: Diagnosis not present

## 2020-06-25 DIAGNOSIS — Z1211 Encounter for screening for malignant neoplasm of colon: Secondary | ICD-10-CM | POA: Diagnosis present

## 2020-06-25 DIAGNOSIS — Z881 Allergy status to other antibiotic agents status: Secondary | ICD-10-CM | POA: Insufficient documentation

## 2020-06-25 DIAGNOSIS — Z87891 Personal history of nicotine dependence: Secondary | ICD-10-CM | POA: Insufficient documentation

## 2020-06-25 DIAGNOSIS — D124 Benign neoplasm of descending colon: Secondary | ICD-10-CM | POA: Diagnosis not present

## 2020-06-25 DIAGNOSIS — Z6835 Body mass index (BMI) 35.0-35.9, adult: Secondary | ICD-10-CM | POA: Insufficient documentation

## 2020-06-25 DIAGNOSIS — K635 Polyp of colon: Secondary | ICD-10-CM | POA: Diagnosis not present

## 2020-06-25 DIAGNOSIS — K644 Residual hemorrhoidal skin tags: Secondary | ICD-10-CM | POA: Diagnosis not present

## 2020-06-25 HISTORY — PX: POLYPECTOMY: SHX5525

## 2020-06-25 HISTORY — PX: COLONOSCOPY WITH PROPOFOL: SHX5780

## 2020-06-25 SURGERY — COLONOSCOPY WITH PROPOFOL
Anesthesia: General

## 2020-06-25 MED ORDER — PROPOFOL 10 MG/ML IV BOLUS
INTRAVENOUS | Status: DC | PRN
  Administered 2020-06-25: 100 mg via INTRAVENOUS

## 2020-06-25 MED ORDER — PROPOFOL 500 MG/50ML IV EMUL
INTRAVENOUS | Status: DC | PRN
  Administered 2020-06-25: 125 ug/kg/min via INTRAVENOUS

## 2020-06-25 MED ORDER — LACTATED RINGERS IV SOLN: INTRAVENOUS | Status: DC

## 2020-06-25 NOTE — H&P (Signed)
@LOGO @   Primary Care Physician:  Sharilyn Sites, MD Primary Gastroenterologist:  Dr. Gala Romney  Pre-Procedure History & Physical: HPI:  Brooke Herring is a 65 y.o. female here for surveillance colonoscopy.  History of multiple polyps removed to rule multiple colonoscopies previously.  Currently, no bowel symptoms doing very well on Benefiber daily.  Past Medical History:  Diagnosis Date  . GERD (gastroesophageal reflux disease)   . Hypothyroidism   . Obesity, unspecified   . OSA (obstructive sleep apnea) 04/23/2013   no cpap  . Other malaise and fatigue   . Sarcoidosis   . Unspecified hypothyroidism     Past Surgical History:  Procedure Laterality Date  . ABDOMINAL HYSTERECTOMY    . COLONOSCOPY  May 2013   Dr. Michail Sermon: hyperplastic polyps, internal hemorrhoids  . ESOPHAGOGASTRODUODENOSCOPY  May 2013   Dr. Michail Sermon: normal esophagus, normal stomach, mucosa abnormality of duodenum but path negative. Negative H.pylori  . TONSILLECTOMY      Prior to Admission medications   Medication Sig Start Date End Date Taking? Authorizing Provider  levothyroxine (SYNTHROID) 88 MCG tablet Take 88 mcg by mouth daily at 6 (six) AM.   Yes [provider]  loratadine (CLARITIN) 10 MG tablet Take 10 mg by mouth every evening.   Yes [provider]  Multiple Vitamin (MULTIVITAMIN WITH MINERALS) TABS tablet Take 1 tablet by mouth every evening.   Yes [provider]  pantoprazole (PROTONIX) 40 MG tablet Take 1 tablet (40 mg total) by mouth daily before breakfast. Patient taking differently: Take 40 mg by mouth every morning. 4 hours after synthroid 05/15/20 05/15/21 Yes Harper, Tivis Ringer, PA-C    Allergies as of 05/04/2020 - Review Complete 05/04/2020  Allergen Reaction Noted  . Azithromycin Other (See Comments) 04/23/2013    Family History  Problem Relation Age of Onset  . Colon cancer Neg Hx     Social History   Socioeconomic History  . Marital status: Married     Spouse name: Not on file  . Number of children: Not on file  . Years of education: Not on file  . Highest education level: Not on file  Occupational History  . Occupation: housewife  Tobacco Use  . Smoking status: Former Smoker    Quit date: 04/24/2011    Years since quitting: 9.1  . Smokeless tobacco: Never Used  Substance and Sexual Activity  . Alcohol use: Not Currently  . Drug use: No  . Sexual activity: Not on file  Other Topics Concern  . Not on file  Social History Narrative  . Not on file   Social Determinants of Health   Financial Resource Strain: Not on file  Food Insecurity: Not on file  Transportation Needs: Not on file  Physical Activity: Not on file  Stress: Not on file  Social Connections: Not on file  Intimate Partner Violence: Not on file    Review of Systems: See HPI, otherwise negative ROS  Physical Exam: BP (!) 146/70   Temp 97.6 F (36.4 C) (Oral)   Resp 18   Ht 5\' 2"  (1.575 m)   Wt 88.9 kg   SpO2 99%   BMI 35.85 kg/m  General:   Alert,  Well-developed, well-nourished, pleasant and cooperative in NAD Neck:  Supple; no masses or thyromegaly. No significant cervical adenopathy. Lungs:  Clear throughout to auscultation.   No wheezes, crackles, or rhonchi. No acute distress. Heart:  Regular rate and rhythm; no murmurs, clicks, rubs,  or gallops. Abdomen: Non-distended,  normal bowel sounds.  Soft and nontender without appreciable mass or hepatosplenomegaly.  Pulses:  Normal pulses noted. Extremities:  Without clubbing or edema.  Impression/Plan: 65 year old lady with a history of colonic adenomas.  Here for surveillance colonoscopy per plan. The risks, benefits, limitations, alternatives and imponderables have been reviewed with the patient. Questions have been answered. All parties are agreeable.      Notice: This dictation was prepared with Dragon dictation along with smaller phrase technology. Any transcriptional errors that result from  this process are unintentional and may not be corrected upon review.

## 2020-06-25 NOTE — Anesthesia Postprocedure Evaluation (Signed)
Anesthesia Post Note  Patient: Brooke Herring  Procedure(s) Performed: COLONOSCOPY WITH PROPOFOL (N/A ) POLYPECTOMY  Patient location during evaluation: Phase II Anesthesia Type: General Level of consciousness: awake and oriented Pain management: satisfactory to patient Vital Signs Assessment: post-procedure vital signs reviewed and stable Respiratory status: spontaneous breathing and respiratory function stable Cardiovascular status: stable Postop Assessment: no apparent nausea or vomiting Anesthetic complications: no   No complications documented.   Last Vitals:  Vitals:   06/25/20 0656  BP: (!) 146/70  Resp: 18  Temp: 36.4 C  SpO2: 99%    Last Pain:  Vitals:   06/25/20 0818  TempSrc:   PainSc: 0-No pain                 Karna Dupes

## 2020-06-25 NOTE — Discharge Instructions (Signed)
Colonoscopy Discharge Instructions  Read the instructions outlined below and refer to this sheet in the next few weeks. These discharge instructions provide you with general information on caring for yourself after you leave the hospital. Your doctor may also give you specific instructions. While your treatment has been planned according to the most current medical practices available, unavoidable complications occasionally occur. If you have any problems or questions after discharge, call Dr. Gala Romney at [phone]. ACTIVITY  You may resume your regular activity, but move at a slower pace for the next 24 hours.   Take frequent rest periods for the next 24 hours.   Walking will help get rid of the air and reduce the bloated feeling in your belly (abdomen).   No driving for 24 hours (because of the medicine (anesthesia) used during the test).    Do not sign any important legal documents or operate any machinery for 24 hours (because of the anesthesia used during the test).  NUTRITION  Drink plenty of fluids.   You may resume your normal diet as instructed by your doctor.   Begin with a light meal and progress to your normal diet. Heavy or fried foods are harder to digest and may make you feel sick to your stomach (nauseated).   Avoid alcoholic beverages for 24 hours or as instructed.  MEDICATIONS  You may resume your normal medications unless your doctor tells you otherwise.  WHAT YOU CAN EXPECT TODAY  Some feelings of bloating in the abdomen.   Passage of more gas than usual.   Spotting of blood in your stool or on the toilet paper.  IF YOU HAD POLYPS REMOVED DURING THE COLONOSCOPY:  No aspirin products for 7 days or as instructed.   No alcohol for 7 days or as instructed.   Eat a soft diet for the next 24 hours.  FINDING OUT THE RESULTS OF YOUR TEST Not all test results are available during your visit. If your test results are not back during the visit, make an appointment  with your caregiver to find out the results. Do not assume everything is normal if you have not heard from your caregiver or the medical facility. It is important for you to follow up on all of your test results.  SEEK IMMEDIATE MEDICAL ATTENTION IF:  You have more than a spotting of blood in your stool.   Your belly is swollen (abdominal distention).   You are nauseated or vomiting.   You have a temperature over 101.   You have abdominal pain or discomfort that is severe or gets worse throughout the day.   1 small polyp found-it was removed.  Internal and external hemorrhoids present.  Formation on hemorrhoids and colon polyps provided  Further recommendations to follow pending review of pathology report  Patient request, I called Theone Murdoch at [phone] -reviewed findings and recommendations   Hemorrhoids Hemorrhoids are swollen veins that may develop:  In the butt (rectum). These are called internal hemorrhoids.  Around the opening of the butt (anus). These are called external hemorrhoids. Hemorrhoids can cause pain, itching, or bleeding. Most of the time, they do not cause serious problems. They usually get better with diet changes, lifestyle changes, and other home treatments. What are the causes? This condition may be caused by:  Having trouble pooping (constipation).  Pushing hard (straining) to poop.  Watery poop (diarrhea).  Pregnancy.  Being very overweight (obese).  Sitting for long periods of time.  Heavy lifting or other activity  that causes you to strain.  Anal sex.  Riding a bike for a long period of time. What are the signs or symptoms? Symptoms of this condition include:  Pain.  Itching or soreness in the butt.  Bleeding from the butt.  Leaking poop.  Swelling in the area.  One or more lumps around the opening of your butt. How is this diagnosed? A doctor can often diagnose this condition by looking at the affected area. The doctor  may also:  Do an exam that involves feeling the area with a gloved hand (digital rectal exam).  Examine the area inside your butt using a small tube (anoscope).  Order blood tests. This may be done if you have lost a lot of blood.  Have you get a test that involves looking inside the colon using a flexible tube with a camera on the end (sigmoidoscopy or colonoscopy). How is this treated? This condition can usually be treated at home. Your doctor may tell you to change what you eat, make lifestyle changes, or try home treatments. If these do not help, procedures can be done to remove the hemorrhoids or make them smaller. These may involve:  Placing rubber bands at the base of the hemorrhoids to cut off their blood supply.  Injecting medicine into the hemorrhoids to shrink them.  Shining a type of light energy onto the hemorrhoids to cause them to fall off.  Doing surgery to remove the hemorrhoids or cut off their blood supply. Follow these instructions at home: Eating and drinking  Eat foods that have a lot of fiber in them. These include whole grains, beans, nuts, fruits, and vegetables.  Ask your doctor about taking products that have added fiber (fibersupplements).  Reduce the amount of fat in your diet. You can do this by: ? Eating low-fat dairy products. ? Eating less red meat. ? Avoiding processed foods.  Drink enough fluid to keep your pee (urine) pale yellow.   Managing pain and swelling  Take a warm-water bath (sitz bath) for 20 minutes to ease pain. Do this 3-4 times a day. You may do this in a bathtub or using a portable sitz bath that fits over the toilet.  If told, put ice on the painful area. It may be helpful to use ice between your warm baths. ? Put ice in a plastic bag. ? Place a towel between your skin and the bag. ? Leave the ice on for 20 minutes, 2-3 times a day.   General instructions  Take over-the-counter and prescription medicines only as told by your  doctor. ? Medicated creams and medicines may be used as told.  Exercise often. Ask your doctor how much and what kind of exercise is best for you.  Go to the bathroom when you have the urge to poop. Do not wait.  Avoid pushing too hard when you poop.  Keep your butt dry and clean. Use wet toilet paper or moist towelettes after pooping.  Do not sit on the toilet for a long time.  Keep all follow-up visits as told by your doctor. This is important. Contact a doctor if you:  Have pain and swelling that do not get better with treatment or medicine.  Have trouble pooping.  Cannot poop.  Have pain or swelling outside the area of the hemorrhoids. Get help right away if you have:  Bleeding that will not stop. Summary  Hemorrhoids are swollen veins in the butt or around the opening of the butt.  They can cause pain, itching, or bleeding.  Eat foods that have a lot of fiber in them. These include whole grains, beans, nuts, fruits, and vegetables.  Take a warm-water bath (sitz bath) for 20 minutes to ease pain. Do this 3-4 times a day. This information is not intended to replace advice given to you by your health care provider. Make sure you discuss any questions you have with your health care provider. Document Revised: 05/24/2018 Document Reviewed: 10/05/2017 Elsevier Patient Education  [place].  Colon Polyps  Colon polyps are tissue growths inside the colon, which is part of the large intestine. They are one of the types of polyps that can grow in the body. A polyp may be a round bump or a mushroom-shaped growth. You could have one polyp or more than one. Most colon polyps are noncancerous (benign). However, some colon polyps can become cancerous over time. Finding and removing the polyps early can help prevent this. What are the causes? The exact cause of colon polyps is not known. What increases the risk? The following factors may make you more likely to develop  this condition:  Having a family history of colorectal cancer or colon polyps.  Being older than 67 years of age.  Being younger than 67 years of age and having a significant family history of colorectal cancer or colon polyps or a genetic condition that puts you at higher risk of getting colon polyps.  Having inflammatory bowel disease, such as ulcerative colitis or Crohn's disease.  Having certain conditions passed from parent to child (hereditary conditions), such as: ? Familial adenomatous polyposis (FAP). ? Lynch syndrome. ? Turcot syndrome. ? Peutz-Jeghers syndrome. ? MUTYH-associated polyposis (MAP).  Being overweight.  Certain lifestyle factors. These include smoking cigarettes, drinking too much alcohol, not getting enough exercise, and eating a diet that is high in fat and red meat and low in fiber.  Having had childhood cancer that was treated with radiation of the abdomen. What are the signs or symptoms? Many times, there are no symptoms. If you have symptoms, they may include:  Blood coming from the rectum during a bowel movement.  Blood in the stool (feces). The blood may be bright red or very dark in color.  Pain in the abdomen.  A change in bowel habits, such as constipation or diarrhea. How is this diagnosed? This condition is diagnosed with a colonoscopy. This is a procedure in which a lighted, flexible scope is inserted into the opening between the buttocks (anus) and then passed into the colon to examine the area. Polyps are sometimes found when a colonoscopy is done as part of routine cancer screening tests. How is this treated? This condition is treated by removing any polyps that are found. Most polyps can be removed during a colonoscopy. Those polyps will then be tested for cancer. Additional treatment may be needed depending on the results of testing. Follow these instructions at home: Eating and drinking  Eat foods that are high in fiber, such as  fruits, vegetables, and whole grains.  Eat foods that are high in calcium and vitamin D, such as milk, cheese, yogurt, eggs, liver, fish, and broccoli.  Limit foods that are high in fat, such as fried foods and desserts.  Limit the amount of red meat, precooked or cured meat, or other processed meat that you eat, such as hot dogs, sausages, bacon, or meat loaves.  Limit sugary drinks.   Lifestyle  Maintain a healthy weight, or lose  weight if recommended by your health care provider.  Exercise every day or as told by your health care provider.  Do not use any products that contain nicotine or tobacco, such as cigarettes, e-cigarettes, and chewing tobacco. If you need help quitting, ask your health care provider.  Do not drink alcohol if: ? Your health care provider tells you not to drink. ? You are pregnant, may be pregnant, or are planning to become pregnant.  If you drink alcohol: ? Limit how much you use to:  0-1 drink a day for women.  0-2 drinks a day for men. ? Know how much alcohol is in your drink. In the [place] one drink equals one 12 oz bottle of beer (355 mL), one 5 oz glass of wine (148 mL), or one 1 oz glass of hard liquor (44 mL). General instructions  Take over-the-counter and prescription medicines only as told by your health care provider.  Keep all follow-up visits. This is important. This includes having regularly scheduled colonoscopies. Talk to your health care provider about when you need a colonoscopy. Contact a health care provider if:  You have new or worsening bleeding during a bowel movement.  You have new or increased blood in your stool.  You have a change in bowel habits.  You lose weight for no known reason. Summary  Colon polyps are tissue growths inside the colon, which is part of the large intestine. They are one type of polyp that can grow in the body.  Most colon polyps are noncancerous (benign), but some can become cancerous over  time.  This condition is diagnosed with a colonoscopy.  This condition is treated by removing any polyps that are found. Most polyps can be removed during a colonoscopy. This information is not intended to replace advice given to you by your health care provider. Make sure you discuss any questions you have with your health care provider. Document Revised: 09/04/2019 Document Reviewed: 09/04/2019 Elsevier Patient Education  2021 [employer].

## 2020-06-25 NOTE — Anesthesia Preprocedure Evaluation (Signed)
Anesthesia Evaluation  Patient identified by MRN, date of birth, ID band Patient awake    Reviewed: Allergy & Precautions, H&P , NPO status , Patient's Chart, lab work & pertinent test results, reviewed documented beta blocker date and time   Airway Mallampati: II  TM Distance: >3 FB Neck ROM: full    Dental no notable dental hx.    Pulmonary neg pulmonary ROS, sleep apnea , former smoker,    Pulmonary exam normal breath sounds clear to auscultation       Cardiovascular Exercise Tolerance: Good negative cardio ROS   Rhythm:regular Rate:Normal     Neuro/Psych negative neurological ROS  negative psych ROS   GI/Hepatic Neg liver ROS, GERD  Medicated,  Endo/Other  negative endocrine ROSHypothyroidism Morbid obesity  Renal/GU negative Renal ROS  negative genitourinary   Musculoskeletal   Abdominal   Peds  Hematology negative hematology ROS (+)   Anesthesia Other Findings Sarcoidosis  Reproductive/Obstetrics negative OB ROS                             Anesthesia Physical Anesthesia Plan  ASA: III  Anesthesia Plan: General   Post-op Pain Management:    Induction:   PONV Risk Score and Plan: TIVA and Propofol infusion  Airway Management Planned:   Additional Equipment:   Intra-op Plan:   Post-operative Plan:   Informed Consent: I have reviewed the patients History and Physical, chart, labs and discussed the procedure including the risks, benefits and alternatives for the proposed anesthesia with the patient or authorized representative who has indicated his/her understanding and acceptance.     Dental Advisory Given  Plan Discussed with: CRNA  Anesthesia Plan Comments:         Anesthesia Quick Evaluation

## 2020-06-25 NOTE — Transfer of Care (Signed)
Immediate Anesthesia Transfer of Care Note  Patient: Brooke Herring  Procedure(s) Performed: COLONOSCOPY WITH PROPOFOL (N/A ) POLYPECTOMY  Patient Location: PACU  Anesthesia Type:General  Level of Consciousness: awake and alert   Airway & Oxygen Therapy: Patient Spontanous Breathing  Post-op Assessment: Report given to RN and Post -op Vital signs reviewed and stable  Post vital signs: Reviewed and stable  Last Vitals:  Vitals Value Taken Time  BP    Temp    Pulse    Resp    SpO2      Last Pain:  Vitals:   06/25/20 0818  TempSrc:   PainSc: 0-No pain      Patients Stated Pain Goal: 5 (74/08/14 4818)  Complications: No complications documented.

## 2020-06-25 NOTE — Op Note (Signed)
[facility] Patient Name: Brooke Herring Procedure Date: 06/25/2020 7:48 AM MRN: [id number] Date of Birth: 1955-06-18 Attending MD: Norvel Richards , MD CSN: [id number] Age: 65 Admit Type: Outpatient Procedure:                Colonoscopy Indications:              High risk colon cancer surveillance: Personal                            history of colonic polyps Providers:                Norvel Richards, MD, Gwenlyn Fudge, RN,                            Kristine L. Risa Grill, Technician, Nelma Rothman,                            [employer] Referring MD:              Medicines:                Propofol per Anesthesia Complications:            No immediate complications. Estimated Blood Loss:     Estimated blood loss was minimal. Procedure:                Pre-Anesthesia Assessment:                           - Prior to the procedure, a History and Physical                            was performed, and patient medications and                            allergies were reviewed. The patient's tolerance of                            previous anesthesia was also reviewed. The risks                            and benefits of the procedure and the sedation                            options and risks were discussed with the patient.                            All questions were answered, and informed consent                            was obtained. Prior Anticoagulants: The patient has                            taken no previous anticoagulant or antiplatelet                            agents. ASA  Grade Assessment: II - A patient with                            mild systemic disease. After reviewing the risks                            and benefits, the patient was deemed in                            satisfactory condition to undergo the procedure.                           After obtaining informed consent, the colonoscope                            was passed under direct vision.  Throughout the                            procedure, the patient's blood pressure, pulse, and                            oxygen saturations were monitored continuously. The                            [id number]) scope was introduced through                            the anus and advanced to the the cecum, identified                            by appendiceal orifice and ileocecal valve. The                            colonoscopy was performed without difficulty. The                            patient tolerated the procedure well. The quality                            of the bowel preparation was adequate. Scope In: 8:25:56 AM Scope Out: 8:39:43 AM Scope Withdrawal Time: 0 hours 11 minutes 9 seconds  Total Procedure Duration: 0 hours 13 minutes 47 seconds  Findings:      Hemorrhoids were found on perianal exam.      A 5 mm polyp was found in the descending colon. The polyp was sessile.       The polyp was removed with a cold snare. Resection and retrieval were       complete. Estimated blood loss was minimal.      Non-bleeding external and internal hemorrhoids were found during       retroflexion. The hemorrhoids were moderate, medium-sized and Grade II       (internal hemorrhoids that prolapse but reduce spontaneously).      The exam was otherwise without abnormality on direct and retroflexion       views. Impression:               -  Hemorrhoids found on perianal exam.                           - One 5 mm polyp in the descending colon, removed                            with a cold snare. Resected and retrieved.                           - Non-bleeding external and internal hemorrhoids.                           - The examination was otherwise normal on direct                            and retroflexion views. Moderate Sedation:      Moderate (conscious) sedation was personally administered by an       anesthesia professional. The following parameters were monitored: oxygen        saturation, heart rate, blood pressure, respiratory rate, EKG, adequacy       of pulmonary ventilation, and response to care. Recommendation:           - Patient has a contact number available for                            emergencies. The signs and symptoms of potential                            delayed complications were discussed with the                            patient. Return to normal activities tomorrow.                            Written discharge instructions were provided to the                            patient.                           - Resume previous diet.                           - Continue present medications.                           - Repeat colonoscopy date to be determined after                            pending pathology results are reviewed for                            surveillance based on pathology results.                           - Return to GI office (  date not yet determined). Procedure Code(s):        --- Professional ---                           [phone], Colonoscopy, flexible; with removal of                            tumor(s), polyp(s), or other lesion(s) by snare                            technique Diagnosis Code(s):        --- Professional ---                           Z86.010, Personal history of colonic polyps                           K64.1, Second degree hemorrhoids                           K63.5, Polyp of colon CPT copyright 2019 American Medical Association. All rights reserved. The codes documented in this report are preliminary and upon coder review may  be revised to meet current compliance requirements. Cristopher Estimable. [name], MD Norvel Richards, MD 06/25/2020 8:51:47 AM This report has been signed electronically. Number of Addenda: 0

## 2020-06-26 LAB — SURGICAL PATHOLOGY

## 2020-06-28 ENCOUNTER — Encounter: Payer: Self-pay | Admitting: Internal Medicine

## 2020-06-28 NOTE — Progress Notes (Signed)
done

## 2020-06-29 ENCOUNTER — Encounter (HOSPITAL_COMMUNITY): Payer: Self-pay | Admitting: Internal Medicine

## 2020-08-05 ENCOUNTER — Telehealth: Payer: Self-pay | Admitting: Internal Medicine

## 2020-08-05 DIAGNOSIS — K219 Gastro-esophageal reflux disease without esophagitis: Secondary | ICD-10-CM

## 2020-08-05 MED ORDER — PANTOPRAZOLE SODIUM 40 MG PO TBEC: 40.0000 mg | DELAYED_RELEASE_TABLET | Freq: Every morning | ORAL | 3 refills | Status: DC

## 2020-08-05 NOTE — Telephone Encounter (Signed)
Completed.

## 2020-08-05 NOTE — Addendum Note (Signed)
Addended by: Annitta Needs on: 08/05/2020 12:24 PM   Modules accepted: Orders

## 2020-08-05 NOTE — Telephone Encounter (Signed)
PATIENT NEEDS HER PANTOPROZOLE 40MG  SENT TO [facility] IN [place].  They accidentally filled hers with 20mg  and they need a new prescription

## 2020-08-05 NOTE — Telephone Encounter (Signed)
Routing to [facility] refill box 

## 2021-03-16 ENCOUNTER — Other Ambulatory Visit (HOSPITAL_COMMUNITY): Payer: Self-pay | Admitting: Internal Medicine

## 2021-03-16 DIAGNOSIS — Z1231 Encounter for screening mammogram for malignant neoplasm of breast: Secondary | ICD-10-CM

## 2021-03-26 ENCOUNTER — Ambulatory Visit (HOSPITAL_COMMUNITY)
Admission: RE | Admit: 2021-03-26 | Discharge: 2021-03-26 | Disposition: A | Discharge status: Home / Self Care | Payer: Medicare PPO | Source: Ambulatory Visit | Attending: Internal Medicine | Admitting: Internal Medicine

## 2021-03-26 ENCOUNTER — Other Ambulatory Visit: Payer: Self-pay

## 2021-03-26 DIAGNOSIS — Z1231 Encounter for screening mammogram for malignant neoplasm of breast: Secondary | ICD-10-CM | POA: Insufficient documentation

## 2021-03-30 ENCOUNTER — Other Ambulatory Visit (HOSPITAL_COMMUNITY): Payer: Self-pay | Admitting: Physician Assistant

## 2021-03-30 DIAGNOSIS — R928 Other abnormal and inconclusive findings on diagnostic imaging of breast: Secondary | ICD-10-CM

## 2021-04-15 ENCOUNTER — Other Ambulatory Visit: Payer: Self-pay

## 2021-04-15 ENCOUNTER — Ambulatory Visit (HOSPITAL_COMMUNITY)
Admission: RE | Admit: 2021-04-15 | Discharge: 2021-04-15 | Disposition: A | Discharge status: Home / Self Care | Payer: Medicare PPO | Source: Ambulatory Visit | Attending: Physician Assistant | Admitting: Physician Assistant

## 2021-04-15 DIAGNOSIS — R928 Other abnormal and inconclusive findings on diagnostic imaging of breast: Secondary | ICD-10-CM

## 2021-05-04 ENCOUNTER — Encounter (HOSPITAL_COMMUNITY): Payer: Medicare PPO

## 2021-05-04 ENCOUNTER — Other Ambulatory Visit (HOSPITAL_COMMUNITY): Payer: Medicare PPO

## 2021-07-20 ENCOUNTER — Other Ambulatory Visit: Payer: Self-pay | Admitting: Gastroenterology

## 2021-07-20 DIAGNOSIS — K219 Gastro-esophageal reflux disease without esophagitis: Secondary | ICD-10-CM

## 2021-07-20 NOTE — Telephone Encounter (Signed)
Needs office visit for refill.

## 2021-07-21 ENCOUNTER — Encounter: Payer: Self-pay | Admitting: Internal Medicine

## 2021-07-24 IMAGING — MG DIGITAL SCREENING BILAT W/ TOMO W/ CAD
8 series · 8 of 24 positions shown · non-contrast
Comparison: Previous exam(s).

CLINICAL DATA: Screening.

EXAM:
DIGITAL SCREENING BILATERAL MAMMOGRAM WITH TOMO AND CAD

[R MLO synth-2D]
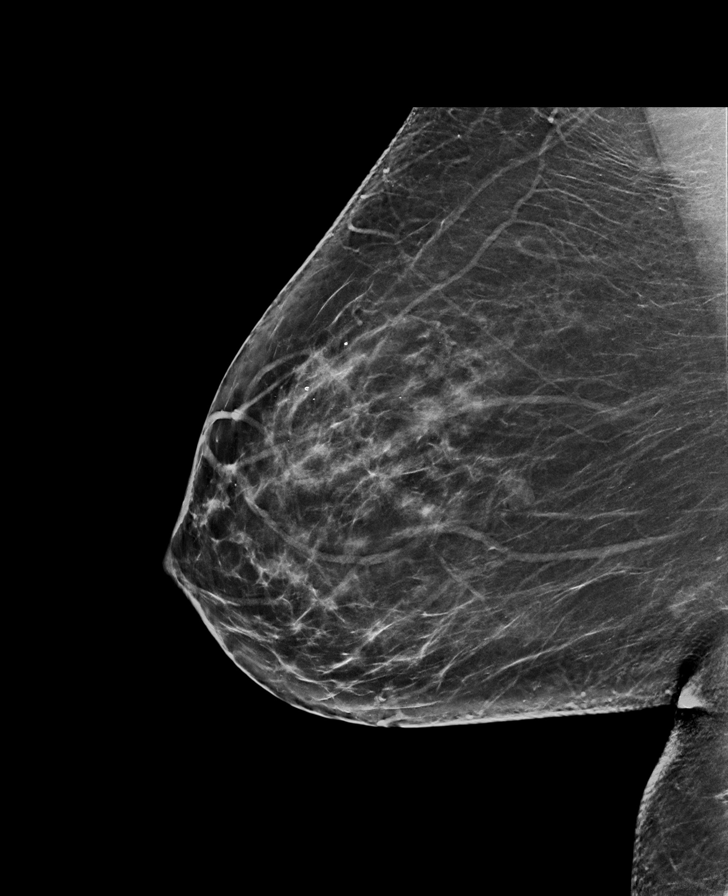

[L MLO synth-2D]
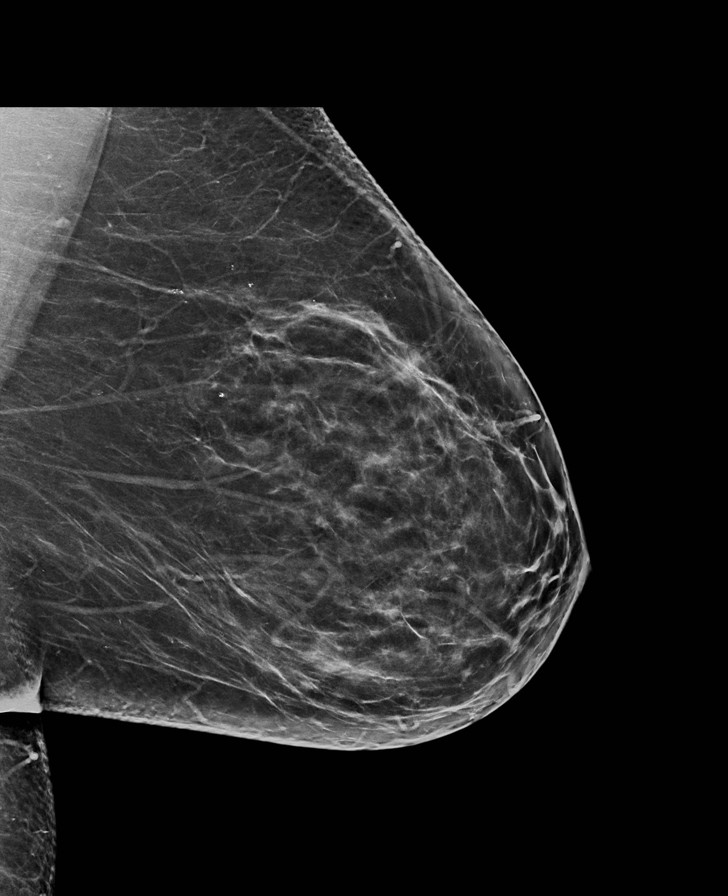

[L CC synth-2D]
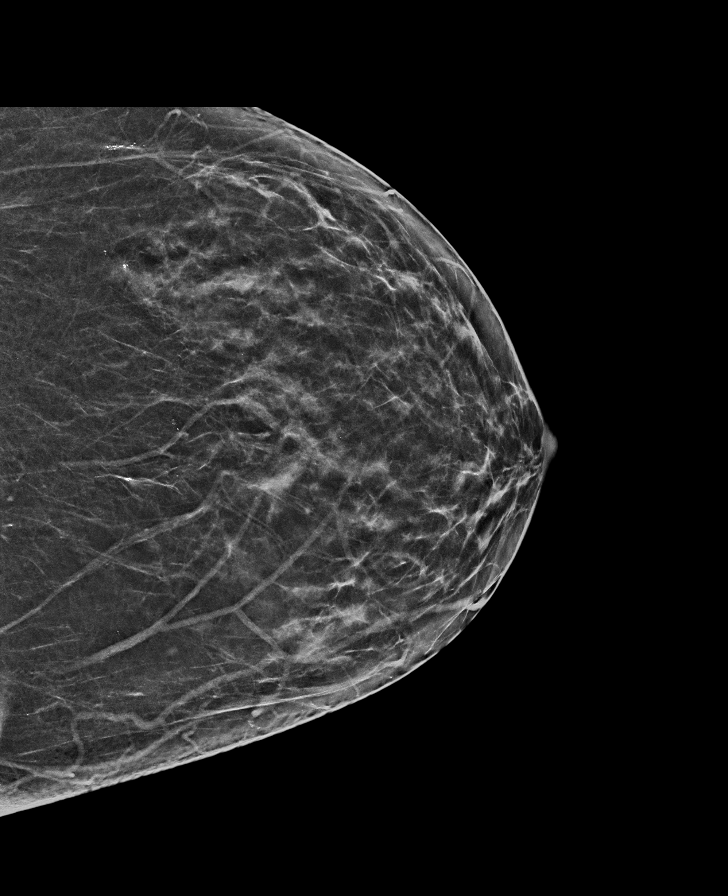

[R CC synth-2D]
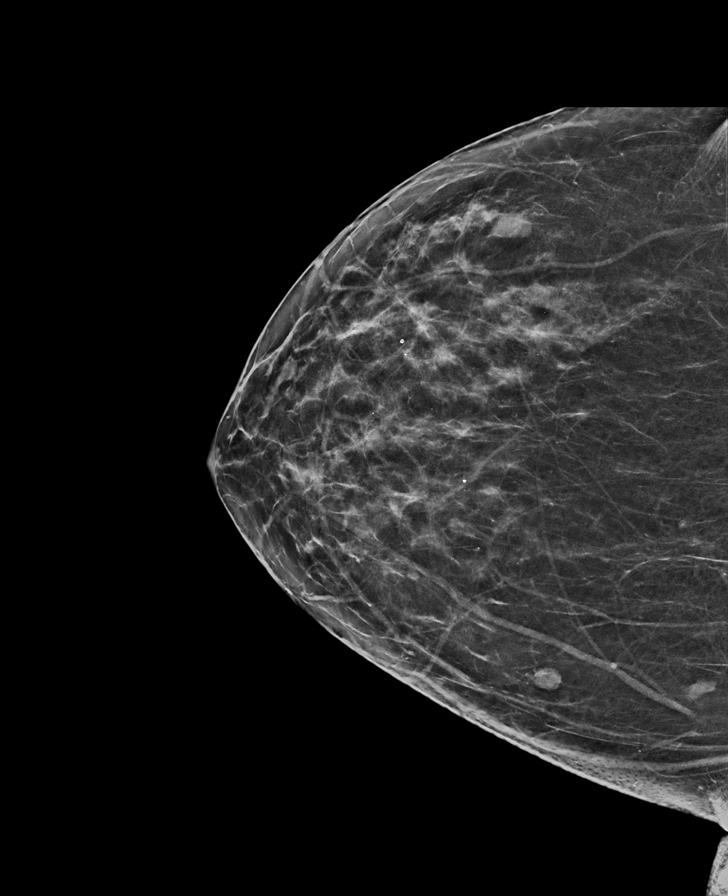

[R CC tomo · tomo slice 33/66.0]
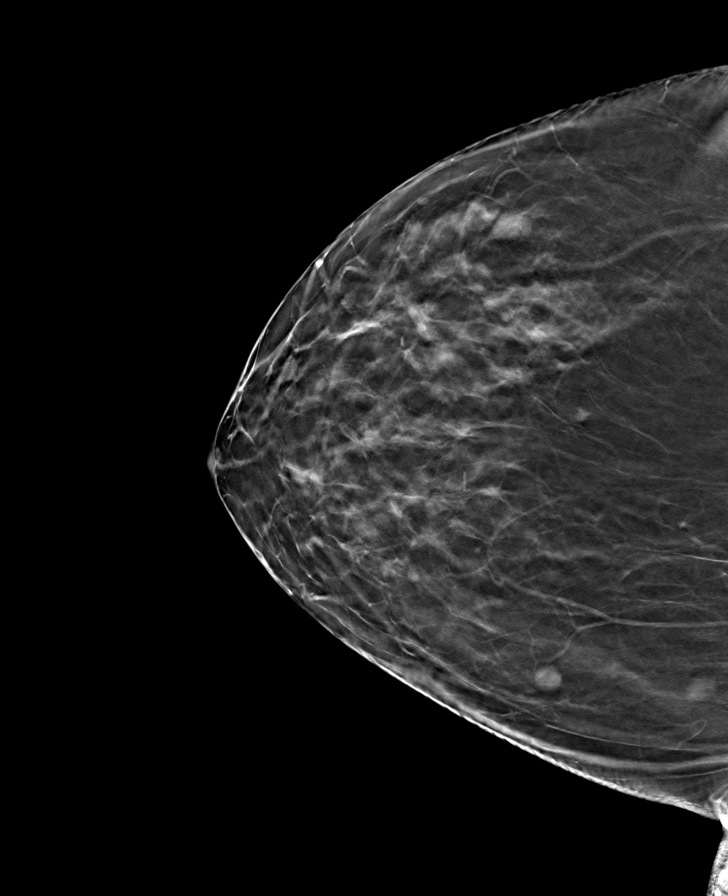

[R MLO tomo · tomo slice 38/75.0]
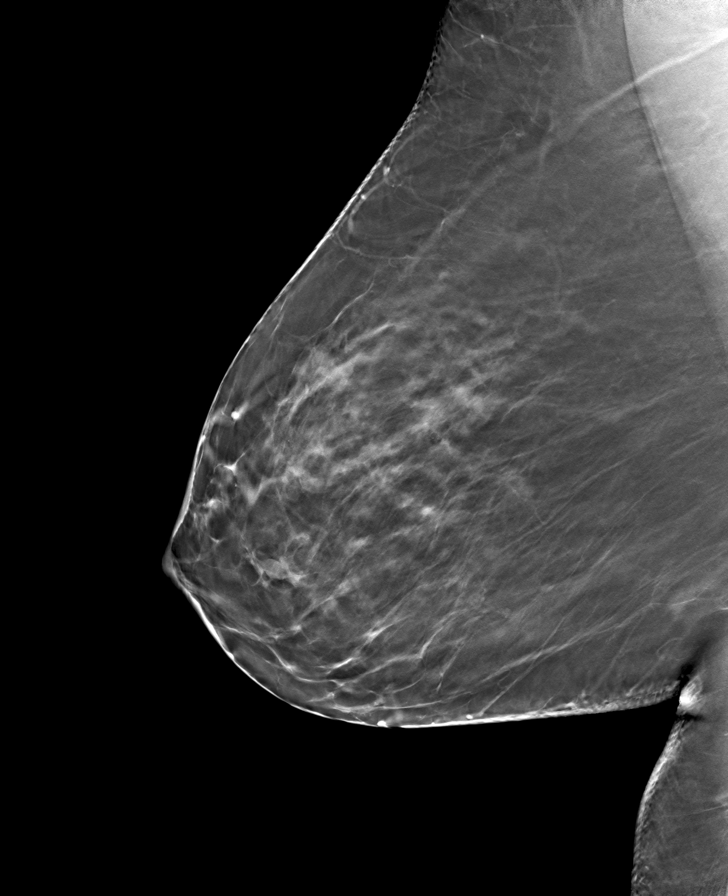

[L MLO tomo · tomo slice 38/75.0]
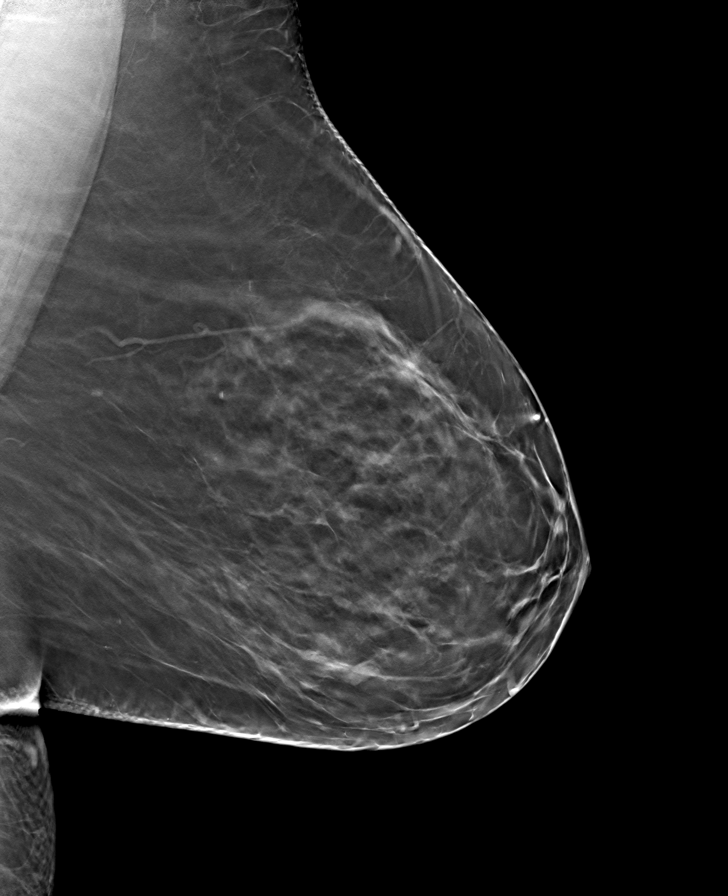

[L CC tomo · tomo slice 33/64.0]
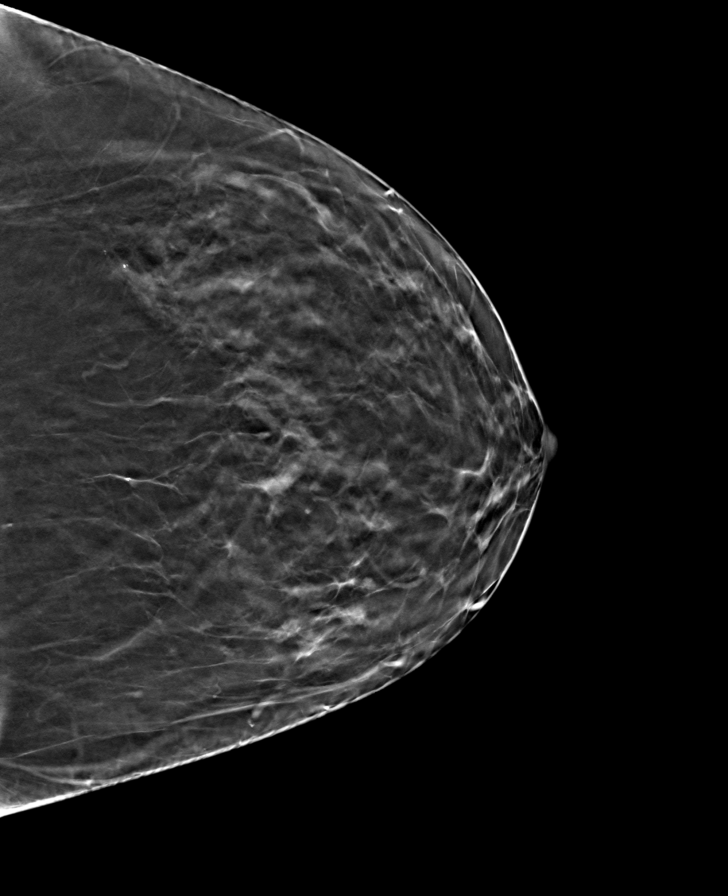

[8 of 24 positions shown; findings below may reference images not displayed]

ACR Breast Density Category c: The breast tissue is heterogeneously
dense, which may obscure small masses.
FINDINGS: There are no findings suspicious for malignancy. Images were
processed with CAD.
IMPRESSION: No mammographic evidence of malignancy. A result letter of this
screening mammogram will be mailed directly to the patient.

RECOMMENDATION:
Screening mammogram in one year. (Code:FT-U-LHB)

BI-RADS CATEGORY  1: Negative.

## 2021-08-04 ENCOUNTER — Encounter: Payer: Self-pay | Admitting: Internal Medicine

## 2021-08-04 ENCOUNTER — Ambulatory Visit: Payer: Medicare PPO | Admitting: Internal Medicine

## 2021-08-04 ENCOUNTER — Other Ambulatory Visit: Payer: Self-pay

## 2021-08-04 VITALS — BP 140/80 | HR 60 | Temp 96.9°F | Ht 62.0 in | Wt 204.8 lb

## 2021-08-04 DIAGNOSIS — K5909 Other constipation: Secondary | ICD-10-CM

## 2021-08-04 DIAGNOSIS — Z86010 Personal history of colonic polyps: Secondary | ICD-10-CM

## 2021-08-04 DIAGNOSIS — K219 Gastro-esophageal reflux disease without esophagitis: Secondary | ICD-10-CM | POA: Diagnosis not present

## 2021-08-04 NOTE — Progress Notes (Signed)
? ? ?Primary Care Physician:  Sharilyn Sites, MD ?Primary Gastroenterologist:  Dr. Gala Romney ? ?Pre-Procedure History & Physical: ?HPI:  Brooke Herring is a 67 y.o. female here for follow-up of constipation and GERD.  Historically, has done well with the pantoprazole 40 mg daily.  She typically takes the medication after she eats lunch.  Notes notices nocturnal breakthrough symptoms no dysphagia.  EGD 2013 elsewhere demonstrated normal esophagus.  Colonoscopy last year yielded a adenomatous polyp; due for surveillance 4 years from now. ? ?She remains obese.  Has chronic constipation may go 4 days without a bowel movement takes variety of over-the-counter agents.  Fairly frequently she has to take an enema to have a bowel movement.  She is not on a regular regimen. ? ?Previously diagnosed with sleep apnea and remains untreated because  -  she did not like the mask. ? ?Past Medical History:  ?Diagnosis Date  ? GERD (gastroesophageal reflux disease)   ? Hypothyroidism   ? Obesity, unspecified   ? OSA (obstructive sleep apnea) 04/23/2013  ? no cpap  ? Other malaise and fatigue   ? Sarcoidosis   ? Unspecified hypothyroidism   ? ? ?Past Surgical History:  ?Procedure Laterality Date  ? ABDOMINAL HYSTERECTOMY    ? COLONOSCOPY  May 2013  ? Dr. Michail Sermon: hyperplastic polyps, internal hemorrhoids  ? COLONOSCOPY WITH PROPOFOL N/A 06/25/2020  ? Procedure: COLONOSCOPY WITH PROPOFOL;  Surgeon: Daneil Dolin, MD;  [facility];  Service: Endoscopy;  Laterality: N/A;  8:15am  ? ESOPHAGOGASTRODUODENOSCOPY  May 2013  ? Dr. Michail Sermon: normal esophagus, normal stomach, mucosa abnormality of duodenum but path negative. Negative H.pylori  ? POLYPECTOMY  06/25/2020  ? Procedure: POLYPECTOMY;  Surgeon: Daneil Dolin, MD;  [facility]: [facility];  Service: Endoscopy;;  ? TONSILLECTOMY    ? ? ?Prior to Admission medications   ?Medication Sig Start Date End Date Taking? Authorizing Provider  ?levothyroxine (SYNTHROID) 88 MCG tablet Take  88 mcg by mouth daily at 6 (six) AM.   Yes [provider]  ?loratadine (CLARITIN) 10 MG tablet Take 10 mg by mouth every evening.   Yes [provider]  ?Multiple Vitamin (MULTIVITAMIN WITH MINERALS) TABS tablet Take 1 tablet by mouth every evening.   Yes [provider]  ?pantoprazole (PROTONIX) 40 MG tablet TAKE 1 TABLET BY MOUTH EVERY MORNING 4 HOURS AFTER SYNTHROID 07/20/21  Yes Sherron Monday, NP  ? ? ?Allergies as of 08/04/2021 - Review Complete 08/04/2021  ?Allergen Reaction Noted  ? Azithromycin Other (See Comments) 04/23/2013  ? ? ?Family History  ?Problem Relation Age of Onset  ? Colon cancer Neg Hx   ? ? ?Social History  ? ?Socioeconomic History  ? Marital status: Married  ?  Spouse name: Not on file  ? Number of children: Not on file  ? Years of education: Not on file  ? Highest education level: Not on file  ?Occupational History  ? Occupation: housewife  ?Tobacco Use  ? Smoking status: Former  ?  Types: Cigarettes  ?  Quit date: 04/24/2011  ?  Years since quitting: 10.2  ? Smokeless tobacco: Never  ?Substance and Sexual Activity  ? Alcohol use: Not Currently  ? Drug use: No  ? Sexual activity: Not on file  ?Other Topics Concern  ? Not on file  ?Social History Narrative  ? Not on file  ? ?Social Determinants of Health  ? ?Financial Resource Strain: Not on file  ?Food Insecurity: Not  on file  ?Transportation Needs: Not on file  ?Physical Activity: Not on file  ?Stress: Not on file  ?Social Connections: Not on file  ?Intimate Partner Violence: Not on file  ? ? ?Review of Systems: ?See HPI, otherwise negative ROS ? ?Physical Exam: ?BP 140/80 (BP Location: Right Arm, Patient Position: Sitting, Cuff Size: Large)   Pulse 60   Temp (!) 96.9 ?F (36.1 ?C) (Temporal)   Ht '5\' 2"'$  (1.575 m)   Wt 204 lb 12.8 oz (92.9 kg)   SpO2 97%   BMI 37.46 kg/m?  ?General:   Alert,  Well-developed, well-nourished, pleasant and cooperative in NAD ?Neck:  Supple; no masses or thyromegaly. No  significant cervical adenopathy. ?Lungs:  Clear throughout to auscultation.   No wheezes, crackles, or rhonchi. No acute distress. ?Heart:  Regular rate and rhythm; no murmurs, clicks, rubs,  or gallops. ?Abdomen: Obese.  Soft and nontender without appreciable mass organomegaly  ?Pulses:  Normal pulses noted. ?Extremities:  Without clubbing or edema. ? ?Impression/Plan: 66 year old lady with suboptimally controlled GERD and poorly controlled constipation.  Untreated sleep apnea. ? ?Needs optimization of pantoprazole administration throughout the day.  I discussed the importance of taking this medication 30 minutes to an hour before she eats.  Its okay to take it before her midday meal but it needs to be administered ahead of the meal. ? ?Constipation without any alarm features.  She ought to be on regular therapy.  No alarm symptoms.  Recent colonoscopy findings reassuring. ? ?Previously diagnosed sleep apnea-untreated.  Discussed how this disorder may be negatively impacting her health globally. ? ?Recommendations: ? ?Constipation and GERD information provided ? ?Continue Protonix 40 mg every day.  This medication is most effective when you take it before meals(30 minutes to 1 hour before eating ) ? ?May use antacids if needed for breakthrough reflux symptoms.  Try reflux gourmet ([email or web address]) as needed for occasional acid reflux symptoms after eating. ? ?Take MiraLAX 17 g orally in 8 ounces of water every night before bedtime.  Would do this every night.  The only reason for holding off on a dose is if loose bowels on a given day. ? ?Plan for repeat colonoscopy 4 years from now. ? ?Get reassessed for sleep apnea.  If you were diagnosed previously, you would likely benefit greatly from treatment. ? ?Call us in 3 to 4 weeks to let us know how you are doing with a progress report. ? ?Office visit with [place] in 3 months ? ? ?Notice: This dictation was prepared with Dragon dictation along with smaller phrase technology.  Any transcriptional errors that result from this process are unintentional and may not be corrected upon review.  ?

## 2021-08-04 NOTE — Patient Instructions (Signed)
It was good to see you again today! ? ?Constipation and GERD information provided ? ?Continue Protonix 40 mg every day.  This medication is most effective when you take it before meals(30 minutes to 1 hour before eating ) ? ?May use antacids if needed for breakthrough reflux symptoms.  Try reflux gourmet ([email or web address]) as needed for occasional acid reflux symptoms after eating. ? ?Take MiraLAX 17 g orally in 8 ounces of water every night before bedtime.  Would do this every night.  The only reason for holding off on a dose is if you had loose bowels on a given day. ? ?Plan for repeat colonoscopy 4 years from now. ? ?Please get reassessed for sleep apnea.  If you were diagnosed previously, you would likely benefit greatly from treatment. ? ?Call us in 3 to 4 weeks to let us know how you are doing with a progress report. ? ?Office visit with [place] in 3 months ?

## 2021-08-21 ENCOUNTER — Other Ambulatory Visit: Payer: Self-pay | Admitting: Gastroenterology

## 2021-08-21 DIAGNOSIS — K219 Gastro-esophageal reflux disease without esophagitis: Secondary | ICD-10-CM

## 2021-08-31 ENCOUNTER — Encounter: Payer: Self-pay | Admitting: Internal Medicine

## 2021-11-09 ENCOUNTER — Ambulatory Visit: Payer: Medicare PPO | Admitting: Gastroenterology

## 2021-11-17 ENCOUNTER — Encounter: Payer: Self-pay | Admitting: Gastroenterology

## 2021-11-17 ENCOUNTER — Ambulatory Visit (INDEPENDENT_AMBULATORY_CARE_PROVIDER_SITE_OTHER): Payer: Medicare PPO | Admitting: Gastroenterology

## 2021-11-17 VITALS — BP 151/85 | HR 65 | Temp 98.2°F | Ht 62.0 in | Wt 200.9 lb

## 2021-11-17 DIAGNOSIS — K219 Gastro-esophageal reflux disease without esophagitis: Secondary | ICD-10-CM

## 2021-11-17 DIAGNOSIS — R1310 Dysphagia, unspecified: Secondary | ICD-10-CM | POA: Diagnosis not present

## 2021-11-17 MED ORDER — PANTOPRAZOLE SODIUM 40 MG PO TBEC: 40.0000 mg | DELAYED_RELEASE_TABLET | Freq: Two times a day (BID) | ORAL | 3 refills | Status: DC

## 2021-11-17 NOTE — H&P (View-Only) (Signed)
Gastroenterology Office Note     Primary Care Physician:  Brooke Sites, MD  Primary Gastroenterologist: Dr. Gala Herring    Chief Complaint   Chief Complaint  Patient presents with   Follow-up    Patient here today for a follow up visit. Patient states her [name] is not controlled with the Pantoprazole 40 mg once per day. She says it is so bad at times she is nauseated from it. She does have occasional issues with gas, bloating.      History of Present Illness   Brooke Herring is a 66 y.o. female presenting today in follow-up with a history of chronic GERD and constipation, last seen in March 2023. She is here for early interval follow-up. Colonoscopy due in 2027 (history of adenoma).   She is taking pantoprazole before lunch on an empty stomach, at least 30 minutes prior. Taking with water. She uses reflux gourmet as needed. No NSAIDs. Drinks one cup of coffee per day. Rare carbonation. Uses air fryer. Baked/grilled foods. Limits fatty foods. Active in garden. No abdominal pain. GERD still not controlled. Difficulty losing weight. She does note dysphagia with meats.        Past Medical History:  Diagnosis Date   GERD (gastroesophageal reflux disease)    Hypothyroidism    Obesity, unspecified    OSA (obstructive sleep apnea) 04/23/2013   no cpap   Other malaise and fatigue    Sarcoidosis    Unspecified hypothyroidism     Past Surgical History:  Procedure Laterality Date   ABDOMINAL HYSTERECTOMY     COLONOSCOPY  09/2011   Dr. Michail Sermon: hyperplastic polyps, internal hemorrhoids   COLONOSCOPY WITH PROPOFOL N/A 06/25/2020   adenoma. surveillance 5 years   ESOPHAGOGASTRODUODENOSCOPY  09/2011   Dr. Michail Sermon: normal esophagus, normal stomach, mucosa abnormality of duodenum but path negative. Negative H.pylori   POLYPECTOMY  06/25/2020   Procedure: POLYPECTOMY;  Surgeon: Daneil Dolin, MD;  Location: [facility];  Service: Endoscopy;;   TONSILLECTOMY      Current  Outpatient Medications  Medication Sig Dispense Refill   levothyroxine (SYNTHROID) 88 MCG tablet Take 88 mcg by mouth daily at 6 (six) AM.     pantoprazole (PROTONIX) 40 MG tablet Take 1 tablet (40 mg total) by mouth 2 (two) times daily before a meal. 60 tablet 3   No current facility-administered medications for this visit.    Allergies as of 11/17/2021 - Review Complete 11/17/2021  Allergen Reaction Noted   Azithromycin Other (See Comments) 04/23/2013    Family History  Problem Relation Age of Onset   Colon cancer Neg Hx     Social History   Socioeconomic History   Marital status: Married    Spouse name: Not on file   Number of children: Not on file   Years of education: Not on file   Highest education level: Not on file  Occupational History   Occupation: housewife  Tobacco Use   Smoking status: Former    Types: Cigarettes    Quit date: 04/24/2011    Years since quitting: 10.5   Smokeless tobacco: Never  Vaping Use   Vaping Use: Never used  Substance and Sexual Activity   Alcohol use: Not Currently   Drug use: No   Sexual activity: Not on file  Other Topics Concern   Not on file  Social History Narrative   Not on file   Social Determinants of Health   Financial Resource Strain: Not  on file  Food Insecurity: Not on file  Transportation Herring: Not on file  Physical Activity: Not on file  Stress: Not on file  Social Connections: Not on file  Intimate Partner Violence: Not on file     Review of Systems   Gen: Denies any fever, chills, fatigue, weight loss, lack of appetite.  CV: Denies chest pain, heart palpitations, peripheral edema, syncope.  Resp: Denies shortness of breath at rest or with exertion. Denies wheezing or cough.  GI: see HPI GU : Denies urinary burning, urinary frequency, urinary hesitancy MS: Denies joint pain, muscle weakness, cramps, or limitation of movement.  Derm: Denies rash, itching, dry skin Psych: Denies depression, anxiety,  memory loss, and confusion Heme: Denies bruising, bleeding, and enlarged lymph nodes.   Physical Exam   BP (!) 151/85 (BP Location: Left Arm, Patient Position: Sitting, Cuff Size: Large)   Pulse 65   Temp 98.2 F (36.8 C) (Oral)   Ht '5\' 2"'$  (1.575 m)   Wt 200 lb 14.4 oz (91.1 kg)   BMI 36.75 kg/m  General:   Alert and oriented. Pleasant and cooperative. Well-nourished and well-developed.  Head:  Normocephalic and atraumatic. Eyes:  Without icterus Abdomen:  +BS, soft, non-tender and non-distended. No HSM noted. No guarding or rebound. No masses appreciated.  Rectal:  Deferred  Msk:  Symmetrical without gross deformities. Normal posture. Extremities:  Without edema. Neurologic:  Alert and  oriented x4;  grossly normal neurologically. Skin:  Intact without significant lesions or rashes. Psych:  Alert and cooperative. Normal mood and affect.   Assessment   Brooke Herring is a 66 y.o. female presenting today in follow-up with a history of chronic GERD, not ideally controlled.   She has employed dietary measures without success. Continues on once daily pantoprazole and has used reflux gourmet as needed. Difficult losing weight.   She also notes solid food dysphagia intermittently. Last EGD in 2013.   Will pursue EGD/dilation in near future. I am increasing pantoprazole to BID for now. May need to try another agent but will await EGD findings.    PLAN    Proceed with upper endoscopy/dilation by Dr. Gala Herring in near future: the risks, benefits, and alternatives have been discussed with the patient in detail. The patient states understanding and desires to proceed. ASA 2 Increase pantoprazole to BID for now Further recommendations to follow    Brooke Needs, PhD, ANP-BC [facility]

## 2021-11-17 NOTE — Progress Notes (Signed)
Gastroenterology Office Note     Primary Care Physician:  Sharilyn Sites, MD  Primary Gastroenterologist: Dr. Gala Romney    Chief Complaint   Chief Complaint  Patient presents with   Follow-up    Patient here today for a follow up visit. Patient states her [name] is not controlled with the Pantoprazole 40 mg once per day. She says it is so bad at times she is nauseated from it. She does have occasional issues with gas, bloating.      History of Present Illness   LUCERO AUZENNE is a 66 y.o. female presenting today in follow-up with a history of chronic GERD and constipation, last seen in March 2023. She is here for early interval follow-up. Colonoscopy due in 2027 (history of adenoma).   She is taking pantoprazole before lunch on an empty stomach, at least 30 minutes prior. Taking with water. She uses reflux gourmet as needed. No NSAIDs. Drinks one cup of coffee per day. Rare carbonation. Uses air fryer. Baked/grilled foods. Limits fatty foods. Active in garden. No abdominal pain. GERD still not controlled. Difficulty losing weight. She does note dysphagia with meats.        Past Medical History:  Diagnosis Date   GERD (gastroesophageal reflux disease)    Hypothyroidism    Obesity, unspecified    OSA (obstructive sleep apnea) 04/23/2013   no cpap   Other malaise and fatigue    Sarcoidosis    Unspecified hypothyroidism     Past Surgical History:  Procedure Laterality Date   ABDOMINAL HYSTERECTOMY     COLONOSCOPY  09/2011   Dr. Michail Sermon: hyperplastic polyps, internal hemorrhoids   COLONOSCOPY WITH PROPOFOL N/A 06/25/2020   adenoma. surveillance 5 years   ESOPHAGOGASTRODUODENOSCOPY  09/2011   Dr. Michail Sermon: normal esophagus, normal stomach, mucosa abnormality of duodenum but path negative. Negative H.pylori   POLYPECTOMY  06/25/2020   Procedure: POLYPECTOMY;  Surgeon: Daneil Dolin, MD;  Location: [facility];  Service: Endoscopy;;   TONSILLECTOMY      Current  Outpatient Medications  Medication Sig Dispense Refill   levothyroxine (SYNTHROID) 88 MCG tablet Take 88 mcg by mouth daily at 6 (six) AM.     pantoprazole (PROTONIX) 40 MG tablet Take 1 tablet (40 mg total) by mouth 2 (two) times daily before a meal. 60 tablet 3   No current facility-administered medications for this visit.    Allergies as of 11/17/2021 - Review Complete 11/17/2021  Allergen Reaction Noted   Azithromycin Other (See Comments) 04/23/2013    Family History  Problem Relation Age of Onset   Colon cancer Neg Hx     Social History   Socioeconomic History   Marital status: Married    Spouse name: Not on file   Number of children: Not on file   Years of education: Not on file   Highest education level: Not on file  Occupational History   Occupation: housewife  Tobacco Use   Smoking status: Former    Types: Cigarettes    Quit date: 04/24/2011    Years since quitting: 10.5   Smokeless tobacco: Never  Vaping Use   Vaping Use: Never used  Substance and Sexual Activity   Alcohol use: Not Currently   Drug use: No   Sexual activity: Not on file  Other Topics Concern   Not on file  Social History Narrative   Not on file   Social Determinants of Health   Financial Resource Strain: Not  on file  Food Insecurity: Not on file  Transportation Needs: Not on file  Physical Activity: Not on file  Stress: Not on file  Social Connections: Not on file  Intimate Partner Violence: Not on file     Review of Systems   Gen: Denies any fever, chills, fatigue, weight loss, lack of appetite.  CV: Denies chest pain, heart palpitations, peripheral edema, syncope.  Resp: Denies shortness of breath at rest or with exertion. Denies wheezing or cough.  GI: see HPI GU : Denies urinary burning, urinary frequency, urinary hesitancy MS: Denies joint pain, muscle weakness, cramps, or limitation of movement.  Derm: Denies rash, itching, dry skin Psych: Denies depression, anxiety,  memory loss, and confusion Heme: Denies bruising, bleeding, and enlarged lymph nodes.   Physical Exam   BP (!) 151/85 (BP Location: Left Arm, Patient Position: Sitting, Cuff Size: Large)   Pulse 65   Temp 98.2 F (36.8 C) (Oral)   Ht '5\' 2"'$  (1.575 m)   Wt 200 lb 14.4 oz (91.1 kg)   BMI 36.75 kg/m  General:   Alert and oriented. Pleasant and cooperative. Well-nourished and well-developed.  Head:  Normocephalic and atraumatic. Eyes:  Without icterus Abdomen:  +BS, soft, non-tender and non-distended. No HSM noted. No guarding or rebound. No masses appreciated.  Rectal:  Deferred  Msk:  Symmetrical without gross deformities. Normal posture. Extremities:  Without edema. Neurologic:  Alert and  oriented x4;  grossly normal neurologically. Skin:  Intact without significant lesions or rashes. Psych:  Alert and cooperative. Normal mood and affect.   Assessment   HALONA AMSTUTZ is a 65 y.o. female presenting today in follow-up with a history of chronic GERD, not ideally controlled.   She has employed dietary measures without success. Continues on once daily pantoprazole and has used reflux gourmet as needed. Difficult losing weight.   She also notes solid food dysphagia intermittently. Last EGD in 2013.   Will pursue EGD/dilation in near future. I am increasing pantoprazole to BID for now. May need to try another agent but will await EGD findings.    PLAN    Proceed with upper endoscopy/dilation by Dr. Gala Romney in near future: the risks, benefits, and alternatives have been discussed with the patient in detail. The patient states understanding and desires to proceed. ASA 2 Increase pantoprazole to BID for now Further recommendations to follow    Annitta Needs, PhD, ANP-BC [facility]

## 2021-11-17 NOTE — Patient Instructions (Signed)
I have sent in pantoprazole to take twice a day 30 minutes before meals.  We are arranging an endoscopy with dilation by Dr. Gala Romney in the near future.  Further recommendations to follow!  It was a pleasure to see you today. I want to create trusting relationships with patients to provide genuine, compassionate, and quality care. I value your feedback. If you receive a survey regarding your visit,  I greatly appreciate you taking time to fill this out.   Annitta Needs, PhD, ANP-BC [facility]

## 2021-11-18 ENCOUNTER — Telehealth: Payer: Self-pay | Admitting: *Deleted

## 2021-11-18 NOTE — Telephone Encounter (Signed)
Called pt. She has been scheduled for egd/dil with Dr. Gala Romney, ASA 2 on 7/13 at 1:45pm. Aware will mail instructions.  PA approved via cohere. Auth# [id number], DOS: 12/09/2021 - 03/09/2022

## 2021-12-09 ENCOUNTER — Ambulatory Visit (HOSPITAL_COMMUNITY): Payer: Medicare PPO | Admitting: Anesthesiology

## 2021-12-09 ENCOUNTER — Encounter (HOSPITAL_COMMUNITY)
Admission: RE | Disposition: A | Discharge status: Home / Self Care | Payer: Self-pay | Source: Home / Self Care | Attending: Internal Medicine

## 2021-12-09 ENCOUNTER — Other Ambulatory Visit: Payer: Self-pay

## 2021-12-09 ENCOUNTER — Ambulatory Visit (HOSPITAL_COMMUNITY)
Admission: RE | Admit: 2021-12-09 | Discharge: 2021-12-09 | Disposition: A | Discharge status: Home / Self Care | Payer: Medicare PPO | Attending: Internal Medicine | Admitting: Internal Medicine

## 2021-12-09 ENCOUNTER — Ambulatory Visit (HOSPITAL_BASED_OUTPATIENT_CLINIC_OR_DEPARTMENT_OTHER): Payer: Medicare PPO | Admitting: Anesthesiology

## 2021-12-09 ENCOUNTER — Encounter (HOSPITAL_COMMUNITY): Payer: Self-pay | Admitting: Internal Medicine

## 2021-12-09 DIAGNOSIS — G4730 Sleep apnea, unspecified: Secondary | ICD-10-CM | POA: Diagnosis not present

## 2021-12-09 DIAGNOSIS — Z79899 Other long term (current) drug therapy: Secondary | ICD-10-CM | POA: Diagnosis not present

## 2021-12-09 DIAGNOSIS — K222 Esophageal obstruction: Secondary | ICD-10-CM

## 2021-12-09 DIAGNOSIS — Z87891 Personal history of nicotine dependence: Secondary | ICD-10-CM | POA: Diagnosis not present

## 2021-12-09 DIAGNOSIS — R1310 Dysphagia, unspecified: Secondary | ICD-10-CM

## 2021-12-09 DIAGNOSIS — K219 Gastro-esophageal reflux disease without esophagitis: Secondary | ICD-10-CM | POA: Diagnosis not present

## 2021-12-09 DIAGNOSIS — K449 Diaphragmatic hernia without obstruction or gangrene: Secondary | ICD-10-CM

## 2021-12-09 DIAGNOSIS — E039 Hypothyroidism, unspecified: Secondary | ICD-10-CM | POA: Diagnosis not present

## 2021-12-09 HISTORY — PX: MALONEY DILATION: SHX5535

## 2021-12-09 HISTORY — PX: ESOPHAGOGASTRODUODENOSCOPY (EGD) WITH PROPOFOL: SHX5813

## 2021-12-09 SURGERY — ESOPHAGOGASTRODUODENOSCOPY (EGD) WITH PROPOFOL
Anesthesia: General

## 2021-12-09 MED ORDER — LACTATED RINGERS IV SOLN: INTRAVENOUS | Status: DC

## 2021-12-09 MED ORDER — PROPOFOL 10 MG/ML IV BOLUS
INTRAVENOUS | Status: DC | PRN
  Administered 2021-12-09: 30 mg via INTRAVENOUS
  Administered 2021-12-09: 100 mg via INTRAVENOUS
  Administered 2021-12-09 (×2): 20 mg via INTRAVENOUS

## 2021-12-09 MED ORDER — LIDOCAINE HCL 1 % IJ SOLN
INTRAMUSCULAR | Status: DC | PRN
  Administered 2021-12-09: 50 mg via INTRADERMAL

## 2021-12-09 NOTE — Op Note (Signed)
[facility] Patient Name: Brooke Herring Procedure Date: 12/09/2021 9:48 AM MRN: [id number] Date of Birth: May 23, 1956 Attending MD: Norvel Richards , MD CSN: [id number] Age: 66 Admit Type: Outpatient Procedure:                Upper GI endoscopy Indications:              Dysphagia Providers:                Norvel Richards, MD, Rosina Lowenstein, RN, Starla Link RN, RN Referring MD:             Norvel Richards, MD Medicines:                Propofol per Anesthesia Complications:            No immediate complications. Estimated Blood Loss:     Estimated blood loss was minimal. Procedure:                Pre-Anesthesia Assessment:                           - Prior to the procedure, a History and Physical                            was performed, and patient medications and                            allergies were reviewed. The patient's tolerance of                            previous anesthesia was also reviewed. The risks                            and benefits of the procedure and the sedation                            options and risks were discussed with the patient.                            All questions were answered, and informed consent                            was obtained. ASA Grade Assessment: II - A patient                            with mild systemic disease. After reviewing the                            risks and benefits, the patient was deemed in                            satisfactory condition to undergo the procedure.  After obtaining informed consent, the endoscope was                            passed under direct vision. Throughout the                            procedure, the patient's blood pressure, pulse, and                            oxygen saturations were monitored continuously. The                            [id number]-H190 ([id number]) scope was introduced through the                             mouth, and advanced to the second part of duodenum.                            The upper GI endoscopy was accomplished without                            difficulty. The patient tolerated the procedure                            well. Scope In: 10:21:25 AM Scope Out: 10:27:55 AM Total Procedure Duration: 0 hours 6 minutes 30 seconds  Findings:      A moderate Schatzki ring was found at the gastroesophageal junction. No       esophagitis. No Barrett's epithelium. No tumor.      A small hiatal hernia was present. Gastric mucosa appeared normal.       Patent pylorus.      The duodenal bulb and second portion of the duodenum were normal. The       scope was withdrawn. Dilation was performed with a Maloney dilator with       mild resistance at 74 Fr. Dilation was performed with a Maloney dilator       with mild resistance at 56 Fr. The dilation site was examined following       endoscope reinsertion and showed moderate mucosal disruption. Estimated       blood loss was minimal. Impression:               - Moderate Schatzki ring. Dilated.                           - Small hiatal hernia. Otherwise, normal stomach.                           - Normal duodenal bulb and second portion of the                            duodenum.                           - No specimens collected. Moderate Sedation:      Moderate (conscious) sedation was personally administered by an  anesthesia professional. The following parameters were monitored: oxygen       saturation, heart rate, blood pressure, respiratory rate, EKG, adequacy       of pulmonary ventilation, and response to care. Recommendation:           - Patient has a contact number available for                            emergencies. The signs and symptoms of potential                            delayed complications were discussed with the                            patient. Return to normal activities tomorrow.                             Written discharge instructions were provided to the                            patient.                           - Resume previous diet.                           - Continue present medications. Continue                            pantoprazole 40 mg daily. Patient admonished to                            take it before a meal twice daily. Office visit                            with [place] in 6 months. Procedure Code(s):        --- Professional ---                           [phone], Esophagogastroduodenoscopy, flexible,                            transoral; diagnostic, including collection of                            specimen(s) by brushing or washing, when performed                            (separate procedure)                           43450, Dilation of esophagus, by unguided sound or                            bougie, single or multiple passes Diagnosis Code(s):        --- Professional ---  K22.2, Esophageal obstruction                           K44.9, Diaphragmatic hernia without obstruction or                            gangrene                           R13.10, Dysphagia, unspecified CPT copyright 2019 American Medical Association. All rights reserved. The codes documented in this report are preliminary and upon coder review may  be revised to meet current compliance requirements. Cristopher Estimable. Madissen Wyse, MD Norvel Richards, MD 12/09/2021 10:40:23 AM This report has been signed electronically. Number of Addenda: 0

## 2021-12-09 NOTE — Anesthesia Preprocedure Evaluation (Signed)
Anesthesia Evaluation  Patient identified by MRN, date of birth, ID band Patient awake    Reviewed: Allergy & Precautions, H&P , NPO status , Patient's Chart, lab work & pertinent test results, reviewed documented beta blocker date and time   Airway Mallampati: II  TM Distance: >3 FB Neck ROM: full    Dental no notable dental hx.    Pulmonary sleep apnea , former smoker,    Pulmonary exam normal breath sounds clear to auscultation       Cardiovascular Exercise Tolerance: Good negative cardio ROS   Rhythm:regular Rate:Normal     Neuro/Psych negative neurological ROS  negative psych ROS   GI/Hepatic Neg liver ROS, GERD  Medicated,  Endo/Other  Hypothyroidism   Renal/GU negative Renal ROS  negative genitourinary   Musculoskeletal negative musculoskeletal ROS (+)   Abdominal   Peds  Hematology negative hematology ROS (+)   Anesthesia Other Findings Sarcoidosis  Reproductive/Obstetrics negative OB ROS                             Anesthesia Physical  Anesthesia Plan  ASA: 3  Anesthesia Plan: General   Post-op Pain Management:    Induction:   PONV Risk Score and Plan: TIVA and Propofol infusion  Airway Management Planned:   Additional Equipment:   Intra-op Plan:   Post-operative Plan:   Informed Consent: I have reviewed the patients History and Physical, chart, labs and discussed the procedure including the risks, benefits and alternatives for the proposed anesthesia with the patient or authorized representative who has indicated his/her understanding and acceptance.     Dental Advisory Given  Plan Discussed with: CRNA  Anesthesia Plan Comments:         Anesthesia Quick Evaluation

## 2021-12-09 NOTE — Discharge Instructions (Addendum)
EGD Discharge instructions Please read the instructions outlined below and refer to this sheet in the next few weeks. These discharge instructions provide you with general information on caring for yourself after you leave the hospital. Your doctor may also give you specific instructions. While your treatment has been planned according to the most current medical practices available, unavoidable complications occasionally occur. If you have any problems or questions after discharge, please call your doctor. ACTIVITY You may resume your regular activity but move at a slower pace for the next 24 hours.  Take frequent rest periods for the next 24 hours.  Walking will help expel (get rid of) the air and reduce the bloated feeling in your abdomen.  No driving for 24 hours (because of the anesthesia (medicine) used during the test).  You may shower.  Do not sign any important legal documents or operate any machinery for 24 hours (because of the anesthesia used during the test).  NUTRITION Drink plenty of fluids.  You may resume your normal diet.  Begin with a light meal and progress to your normal diet.  Avoid alcoholic beverages for 24 hours or as instructed by your caregiver.  MEDICATIONS You may resume your normal medications unless your caregiver tells you otherwise.  WHAT YOU CAN EXPECT TODAY You may experience abdominal discomfort such as a feeling of fullness or "gas" pains.  FOLLOW-UP Your doctor will discuss the results of your test with you.  SEEK IMMEDIATE MEDICAL ATTENTION IF ANY OF THE FOLLOWING OCCUR: Excessive nausea (feeling sick to your stomach) and/or vomiting.  Severe abdominal pain and distention (swelling).  Trouble swallowing.  Temperature over 101 F (37.8 C).  Rectal bleeding or vomiting of blood.    You had a Schatzki's ring and a hiatal hernia today.  Your ring was causing difficulty swallowing beef.  It was stretched.  Continue taking pantoprazole or Protonix 40 mg  twice daily (before meals)  Office visit with [place] in 6 months  At patient request, call Shanon Brow at [phone] findings and recommendations

## 2021-12-09 NOTE — Anesthesia Postprocedure Evaluation (Signed)
Anesthesia Post Note  Patient: Brooke Herring  Procedure(s) Performed: ESOPHAGOGASTRODUODENOSCOPY (EGD) WITH PROPOFOL [place]  Patient location during evaluation: Endoscopy Anesthesia Type: General Level of consciousness: awake and alert Pain management: pain level controlled Vital Signs Assessment: post-procedure vital signs reviewed and stable Respiratory status: spontaneous breathing Cardiovascular status: blood pressure returned to baseline Postop Assessment: no apparent nausea or vomiting Anesthetic complications: no   No notable events documented.   Last Vitals:  Vitals:   12/09/21 0820  BP: (!) 163/78  Pulse: 60  Resp: 12  Temp: 36.8 C  SpO2: 98%    Last Pain:  Vitals:   12/09/21 1016  TempSrc:   PainSc: 0-No pain                 Nyna Chilton

## 2021-12-09 NOTE — Interval H&P Note (Signed)
History and Physical Interval Note:  12/09/2021 10:07 AM  Brooke Herring  has presented today for surgery, with the diagnosis of DYSPHAGIA.  The various methods of treatment have been discussed with the patient and family. After consideration of risks, benefits and other options for treatment, the patient has consented to  Procedure(s) with comments: ESOPHAGOGASTRODUODENOSCOPY (EGD) WITH PROPOFOL (N/A) - 1:45pm, pt knows to arrive at 8:15 - told nothing to drink after midnight Brooke Herring (N/A) as a surgical intervention.  The patient's history has been reviewed, patient examined, no change in status, stable for surgery.  I have reviewed the patient's chart and labs.  Questions were answered to the patient's satisfaction.     Brooke Herring   Reflux well-controlled on pantoprazole 40 mg twice daily.  But now with esophageal dysphagia to beef. Patient here for an EGD with possible esophageal dilation as feasible/appropriate per plan. The risks, benefits, limitations, alternatives and imponderables have been reviewed with the patient. Potential for esophageal dilation, biopsy, etc. have also been reviewed.  Questions have been answered. All parties agreeable.

## 2021-12-09 NOTE — Transfer of Care (Signed)
Immediate Anesthesia Transfer of Care Note  Patient: Brooke Herring  Procedure(s) Performed: ESOPHAGOGASTRODUODENOSCOPY (EGD) WITH PROPOFOL [place]  Patient Location: Endoscopy Unit  Anesthesia Type:General  Level of Consciousness: awake  Airway & Oxygen Therapy: Patient Spontanous Breathing  Post-op Assessment: Report given to RN  Post vital signs: Reviewed and stable  Last Vitals:  Vitals Value Taken Time  BP    Temp    Pulse    Resp    SpO2      Last Pain:  Vitals:   12/09/21 1016  TempSrc:   PainSc: 0-No pain      Patients Stated Pain Goal: 9 (54/98/26 4158)  Complications: No notable events documented.

## 2021-12-15 ENCOUNTER — Encounter (HOSPITAL_COMMUNITY): Payer: Self-pay | Admitting: Internal Medicine

## 2022-03-08 ENCOUNTER — Other Ambulatory Visit (HOSPITAL_COMMUNITY): Payer: Self-pay | Admitting: Family Medicine

## 2022-03-08 DIAGNOSIS — Z1231 Encounter for screening mammogram for malignant neoplasm of breast: Secondary | ICD-10-CM

## 2022-03-08 DIAGNOSIS — Z13820 Encounter for screening for osteoporosis: Secondary | ICD-10-CM

## 2022-04-04 ENCOUNTER — Ambulatory Visit (HOSPITAL_COMMUNITY)
Admission: RE | Admit: 2022-04-04 | Discharge: 2022-04-04 | Disposition: A | Discharge status: Home / Self Care | Payer: Medicare PPO | Source: Ambulatory Visit | Attending: Family Medicine | Admitting: Family Medicine

## 2022-04-04 DIAGNOSIS — Z780 Asymptomatic menopausal state: Secondary | ICD-10-CM | POA: Insufficient documentation

## 2022-04-04 DIAGNOSIS — Z1231 Encounter for screening mammogram for malignant neoplasm of breast: Secondary | ICD-10-CM | POA: Insufficient documentation

## 2022-04-04 DIAGNOSIS — Z13820 Encounter for screening for osteoporosis: Secondary | ICD-10-CM | POA: Insufficient documentation

## 2022-04-14 ENCOUNTER — Other Ambulatory Visit (HOSPITAL_COMMUNITY): Payer: Self-pay | Admitting: Family Medicine

## 2022-04-14 DIAGNOSIS — F17201 Nicotine dependence, unspecified, in remission: Secondary | ICD-10-CM

## 2022-05-02 ENCOUNTER — Other Ambulatory Visit: Payer: Self-pay | Admitting: Gastroenterology

## 2022-05-25 ENCOUNTER — Ambulatory Visit (HOSPITAL_COMMUNITY)
Admission: RE | Admit: 2022-05-25 | Discharge: 2022-05-25 | Disposition: A | Discharge status: Home / Self Care | Payer: Medicare PPO | Source: Ambulatory Visit | Attending: Family Medicine | Admitting: Family Medicine

## 2022-05-25 DIAGNOSIS — Z87891 Personal history of nicotine dependence: Secondary | ICD-10-CM | POA: Diagnosis not present

## 2022-05-25 DIAGNOSIS — F17201 Nicotine dependence, unspecified, in remission: Secondary | ICD-10-CM | POA: Insufficient documentation

## 2022-05-25 DIAGNOSIS — Z136 Encounter for screening for cardiovascular disorders: Secondary | ICD-10-CM | POA: Diagnosis not present

## 2022-06-10 ENCOUNTER — Ambulatory Visit: Payer: Medicare PPO | Admitting: Gastroenterology

## 2022-06-10 ENCOUNTER — Encounter: Payer: Self-pay | Admitting: Gastroenterology

## 2022-06-10 VITALS — BP 134/86 | HR 68 | Temp 97.8°F | Ht 62.0 in | Wt 199.6 lb

## 2022-06-10 DIAGNOSIS — K219 Gastro-esophageal reflux disease without esophagitis: Secondary | ICD-10-CM | POA: Diagnosis not present

## 2022-06-10 NOTE — Progress Notes (Signed)
GI Office Note    Referring Provider: Sharilyn Sites, MD Primary Care Physician:  Sharilyn Sites, MD  Primary Gastroenterologist: Garfield Cornea, MD   Chief Complaint   Chief Complaint  Patient presents with   Follow-up    History of Present Illness   Brooke Herring is a 67 y.o. female presenting today for follow up of GERD, constipation, dysphagia.   She is doing well. Dysphagia resolved after esophageal dilation. Her reflux is well controlled. She takes pantoprazole once every morning and on some days she takes second dose if she plans to eat trigger food. No abdominal pain. BMs regular. No melena, brbpr  EGD 11/2021: -moderate schatzki ring s/p dilation -small hiatal hernia    Medications   Current Outpatient Medications  Medication Sig Dispense Refill   acetaminophen (TYLENOL) 500 MG tablet Take 500-1,000 mg by mouth every 6 (six) hours as needed (for pain.).     levothyroxine (SYNTHROID) 100 MCG tablet Take 100 mcg by mouth daily at 6 (six) AM.     pantoprazole (PROTONIX) 40 MG tablet TAKE 1 TABLET BY MOUTH TWICE DAILY BEFORE A MEAL 60 tablet 5   rosuvastatin (CRESTOR) 5 MG tablet SMARTSIG:1 Tablet(s) By Mouth Every Evening     No current facility-administered medications for this visit.    Allergies   Allergies as of 06/10/2022 - Review Complete 12/09/2021  Allergen Reaction Noted   Zithromax [azithromycin] Other (See Comments) 04/23/2013     Review of Systems   General: Negative for anorexia, weight loss, fever, chills, fatigue, weakness. ENT: Negative for hoarseness, difficulty swallowing , nasal congestion. CV: Negative for chest pain, angina, palpitations, dyspnea on exertion, peripheral edema.  Respiratory: Negative for dyspnea at rest, dyspnea on exertion, cough, sputum, wheezing.  GI: See history of present illness. GU:  Negative for dysuria, hematuria, urinary incontinence, urinary frequency, nocturnal urination.  Endo: Negative for unusual weight  change.     Physical Exam   BP 134/86 (BP Location: Right Arm, Patient Position: Sitting, Cuff Size: Large)   Pulse 68   Temp 97.8 F (36.6 C) (Oral)   Ht '5\' 2"'$  (1.575 m)   Wt 199 lb 9.6 oz (90.5 kg)   SpO2 95%   BMI 36.51 kg/m    General: Well-nourished, well-developed in no acute distress.  Eyes: No icterus. Mouth: Oropharyngeal mucosa moist and pink   Abdomen: Bowel sounds are normal, nontender, nondistended, no hepatosplenomegaly or masses,  no abdominal bruits or hernia, no rebound or guarding.  Rectal: not performed Extremities: No lower extremity edema. No clubbing or deformities. Neuro: Alert and oriented x 4   Skin: Warm and dry, no jaundice.   Psych: Alert and cooperative, normal mood and affect.  Labs   None available  Imaging Studies   CT CHEST LUNG CA SCREEN LOW DOSE W/O CM  Result Date: 05/29/2022 CLINICAL DATA:  67 year old female former smoker (quit 10 years ago) with 40 pack-year history of smoking. Lung cancer screening examination. EXAM: CT CHEST WITHOUT CONTRAST LOW-DOSE FOR LUNG CANCER SCREENING TECHNIQUE: Multidetector CT imaging of the chest was performed following the standard protocol without IV contrast. RADIATION DOSE REDUCTION: This exam was performed according to the departmental dose-optimization program which includes automated exposure control, adjustment of the mA and/or kV according to patient size and/or use of iterative reconstruction technique. COMPARISON:  No priors. FINDINGS: Cardiovascular: Heart size is normal. There is no significant pericardial fluid, thickening or pericardial calcification. Aortic atherosclerosis. No definite coronary artery calcifications. Mediastinum/Nodes: No  pathologically enlarged mediastinal or hilar lymph nodes. Please note that accurate exclusion of hilar adenopathy is limited on noncontrast CT scans. Esophagus is unremarkable in appearance. No axillary lymphadenopathy. Lungs/Pleura: Tiny pulmonary nodule in the  anterior aspect of the left lower lobe abutting the major fissure (axial image 160) with a volume derived mean diameter of only 4.4 mm. No larger more suspicious appearing pulmonary nodules or masses are noted. No acute consolidative airspace disease. No pleural effusions. Mild diffuse bronchial wall thickening with very mild centrilobular and paraseptal emphysema. Upper Abdomen: Aortic atherosclerosis. Musculoskeletal: There are no aggressive appearing lytic or blastic lesions noted in the visualized portions of the skeleton. IMPRESSION: 1. Lung-RADS 2, benign appearance or behavior. Continue annual screening with low-dose chest CT without contrast in 12 months. 2. Aortic atherosclerosis. 3. Mild diffuse bronchial wall thickening with very mild centrilobular and paraseptal emphysema; imaging findings suggestive of underlying COPD. Aortic Atherosclerosis (ICD10-I70.0) and Emphysema (ICD10-J43.9). Electronically Signed   By: Vinnie Langton M.D.   On: 05/29/2022 07:30    Assessment   GERD: doing very well. Using PPI mostly only once daily. Dysphagia resolved s/p esophageal dilation. Trying to follow antireflux measures.    PLAN   Continue pantoprazole '40mg'$  once to twice daily.  Return office visit in one year of sooner if needed.    Laureen Ochs. Bobby Rumpf, [place], [facility]

## 2022-06-10 NOTE — Patient Instructions (Signed)
Continue pantoprazole '40mg'$  once to twice daily.  Return office visit in one year or call sooner if needed.

## 2023-03-02 ENCOUNTER — Other Ambulatory Visit: Payer: Self-pay | Admitting: Gastroenterology

## 2023-04-17 DIAGNOSIS — J029 Acute pharyngitis, unspecified: Secondary | ICD-10-CM | POA: Diagnosis not present

## 2023-04-17 DIAGNOSIS — J019 Acute sinusitis, unspecified: Secondary | ICD-10-CM | POA: Diagnosis not present

## 2023-04-17 DIAGNOSIS — Z20822 Contact with and (suspected) exposure to covid-19: Secondary | ICD-10-CM | POA: Diagnosis not present

## 2023-05-18 ENCOUNTER — Other Ambulatory Visit (HOSPITAL_COMMUNITY): Payer: Self-pay | Admitting: Family Medicine

## 2023-05-18 DIAGNOSIS — Z1231 Encounter for screening mammogram for malignant neoplasm of breast: Secondary | ICD-10-CM

## 2023-06-01 DIAGNOSIS — H40053 Ocular hypertension, bilateral: Secondary | ICD-10-CM | POA: Diagnosis not present

## 2023-06-01 DIAGNOSIS — H2513 Age-related nuclear cataract, bilateral: Secondary | ICD-10-CM | POA: Diagnosis not present

## 2023-06-02 ENCOUNTER — Encounter (HOSPITAL_COMMUNITY): Payer: Self-pay

## 2023-06-02 ENCOUNTER — Ambulatory Visit: Payer: Medicare PPO | Admitting: Internal Medicine

## 2023-06-02 ENCOUNTER — Encounter: Payer: Self-pay | Admitting: Internal Medicine

## 2023-06-02 ENCOUNTER — Ambulatory Visit (HOSPITAL_COMMUNITY)
Admission: RE | Admit: 2023-06-02 | Discharge: 2023-06-02 | Disposition: A | Discharge status: Home / Self Care | Payer: Medicare PPO | Source: Ambulatory Visit | Attending: Family Medicine | Admitting: Family Medicine

## 2023-06-02 VITALS — BP 135/80 | HR 63 | Temp 97.8°F | Ht 62.0 in | Wt 193.6 lb

## 2023-06-02 DIAGNOSIS — K219 Gastro-esophageal reflux disease without esophagitis: Secondary | ICD-10-CM

## 2023-06-02 DIAGNOSIS — Z860101 Personal history of adenomatous and serrated colon polyps: Secondary | ICD-10-CM

## 2023-06-02 DIAGNOSIS — Z1231 Encounter for screening mammogram for malignant neoplasm of breast: Secondary | ICD-10-CM | POA: Diagnosis not present

## 2023-06-02 DIAGNOSIS — R197 Diarrhea, unspecified: Secondary | ICD-10-CM

## 2023-06-02 NOTE — Patient Instructions (Signed)
 It was nice to see you again today!  For your diarrhea, you may take an Imodium A-D up to 4 times a day as needed needed  Maintain hydration  We will send a stool sample off today (gastrointestinal panel plus C. difficile assay)  May continue Protonix  40 mg daily once daily should suffice  Plan for surveillance colonoscopy in 2029 (history of colonic adenoma)  Further recommendations to follow.

## 2023-06-02 NOTE — Progress Notes (Signed)
 Primary Care Physician:  Marvine Rush, MD Primary Gastroenterologist:  Dr. Shaaron  Pre-Procedure History & Physical: HPI:  Brooke Herring is a very pleasant 68 year old lady with a history of GERD well-controlled on Protonix  once daily and history of colonic adenoma due for surveillance colonoscopy 2029;  presents now with a new problem-that of diarrhea which started acutely about 5 weeks ago.  Approximately 6 weeks ago patient given amoxicillin and steroids for sinusitis  -  a week later she came down with COVID.  She pretty much recovered from COVID; day after Thanksgiving she developed copious nonbloody diarrhea.  No abdominal pain, fever, nausea or vomiting she is maintaining hydration.  No blood in stools.  Past Medical History:  Diagnosis Date   GERD (gastroesophageal reflux disease)    Hypothyroidism    Obesity, unspecified    OSA (obstructive sleep apnea) 04/23/2013   no cpap   Other malaise and fatigue    Sarcoidosis    Unspecified hypothyroidism     Past Surgical History:  Procedure Laterality Date   ABDOMINAL HYSTERECTOMY     COLONOSCOPY  09/2011   Dr. Dianna: hyperplastic polyps, internal hemorrhoids   COLONOSCOPY WITH PROPOFOL  N/A 06/25/2020   adenoma. surveillance 5 years   ESOPHAGOGASTRODUODENOSCOPY  09/2011   Dr. Dianna: normal esophagus, normal stomach, mucosa abnormality of duodenum but path negative. Negative H.pylori   ESOPHAGOGASTRODUODENOSCOPY (EGD) WITH PROPOFOL  N/A 12/09/2021   Procedure: ESOPHAGOGASTRODUODENOSCOPY (EGD) WITH PROPOFOL ;  Surgeon: Shaaron Lamar HERO, MD;  Location: [facility];  Service: Endoscopy;  Laterality: N/A;  1:45pm, pt knows to arrive at 8:15 - told nothing to drink after midnight   MALONEY DILATION N/A 12/09/2021   Procedure: AGAPITO DILATION;  Surgeon: Shaaron Lamar HERO, MD;  Location: [facility];  Service: Endoscopy;  Laterality: N/A;   POLYPECTOMY  06/25/2020   Procedure: POLYPECTOMY;  Surgeon: Shaaron Lamar HERO, MD;  Location:  [facility];  Service: Endoscopy;;   TONSILLECTOMY      Prior to Admission medications   Medication Sig Start Date End Date Taking? Authorizing Provider  acetaminophen  (TYLENOL ) 500 MG tablet Take 500-1,000 mg by mouth every 6 (six) hours as needed (for pain.).   Yes [provider]  levothyroxine (SYNTHROID) 100 MCG tablet Take 100 mcg by mouth daily at 6 (six) AM.   Yes [provider]  pantoprazole  (PROTONIX ) 40 MG tablet TAKE 1 TABLET BY MOUTH TWICE DAILY BEFORE A MEAL 03/02/23  Yes Ezzard Sonny RAMAN, PA-C  rosuvastatin (CRESTOR) 5 MG tablet SMARTSIG:1 Tablet(s) By Mouth Every Evening 06/03/22  Yes [provider]    Allergies as of 06/02/2023 - Review Complete 06/02/2023  Allergen Reaction Noted   Zithromax [azithromycin] Other (See Comments) 04/23/2013    Family History  Problem Relation Age of Onset   Colon cancer Neg Hx     Social History   Socioeconomic History   Marital status: Married    Spouse name: Not on file   Number of children: Not on file   Years of education: Not on file   Highest education level: Not on file  Occupational History   Occupation: housewife  Tobacco Use   Smoking status: Former    Current packs/day: 0.00    Types: Cigarettes    Quit date: 04/24/2011    Years since quitting: 12.1   Smokeless tobacco: Never  Vaping Use   Vaping status: Never Used  Substance and Sexual Activity   Alcohol use: Not Currently   Drug use:  No   Sexual activity: Not Currently  Other Topics Concern   Not on file  Social History Narrative   Not on file   Social Drivers of Health   Financial Resource Strain: Not on file  Food Insecurity: Not on file  Transportation Needs: Not on file  Physical Activity: Not on file  Stress: Not on file  Social Connections: Not on file  Intimate Partner Violence: Not on file    Review of Systems: See HPI, otherwise negative ROS  Physical Exam: BP 135/80 (BP Location: Right Arm, Patient  Position: Sitting, Cuff Size: Large)   Pulse 63   Temp 97.8 F (36.6 C) (Oral)   Ht 5' 2 (1.575 m)   Wt 193 lb 9.6 oz (87.8 kg)   SpO2 95%   BMI 35.41 kg/m  General:   Alert,  Well-developed, well-nourished, pleasant and cooperative in NAD Neck:  Supple; no masses or thyromegaly. No significant cervical adenopathy. Lungs:  Clear throughout to auscultation.   No wheezes, crackles, or rhonchi. No acute distress. Heart:  Regular rate and rhythm; no murmurs, clicks, rubs,  or gallops. Abdomen: Non-distended, normal bowel sounds.  Soft and nontender without appreciable mass or hepatosplenomegaly.   Impression/Plan: 68 year old lady with a 5-week history of nonbloody diarrhea.  Recent antibiotics and steroids.  C. difficile infection high on the differential list.  This is likely an infectious diarrhea with other causes being less likely.  She does not at all look acutely ill or toxic at this time.  Her GERD has been well-controlled on Protonix  40 mg daily  History of colonic adenoma-due for surveillance colonoscopy 2029.  Recommendations:  take an Imodium A-D up to 4 times a day as needed needed  Maintain hydration  We will send a stool sample off today (gastrointestinal panel plus C. difficile assay)  May continue Protonix  40 mg daily once daily should suffice  Plan for surveillance colonoscopy in 2029 (history of colonic adenoma)  Further recommendations to follow.       Notice: This dictation was prepared with Dragon dictation along with smaller phrase technology. Any transcriptional errors that result from this process are unintentional and may not be corrected upon review.

## 2023-06-05 DIAGNOSIS — R197 Diarrhea, unspecified: Secondary | ICD-10-CM | POA: Diagnosis not present

## 2023-06-06 LAB — GI PROFILE, STOOL, PCR

## 2023-06-07 ENCOUNTER — Other Ambulatory Visit: Payer: Self-pay

## 2023-06-07 LAB — C DIFFICILE TOXINS A+B W/RFLX: C difficile Toxins A+B, EIA: POSITIVE — AB

## 2023-06-07 MED ORDER — VANCOMYCIN HCL 125 MG PO CAPS: 125.0000 mg | ORAL_CAPSULE | Freq: Four times a day (QID) | ORAL | 0 refills | Status: AC

## 2023-06-13 DIAGNOSIS — H2513 Age-related nuclear cataract, bilateral: Secondary | ICD-10-CM | POA: Diagnosis not present

## 2023-06-13 DIAGNOSIS — H40023 Open angle with borderline findings, high risk, bilateral: Secondary | ICD-10-CM | POA: Diagnosis not present

## 2023-06-29 DIAGNOSIS — G4733 Obstructive sleep apnea (adult) (pediatric): Secondary | ICD-10-CM | POA: Diagnosis not present

## 2023-06-29 DIAGNOSIS — F17201 Nicotine dependence, unspecified, in remission: Secondary | ICD-10-CM | POA: Diagnosis not present

## 2023-06-29 DIAGNOSIS — E6609 Other obesity due to excess calories: Secondary | ICD-10-CM | POA: Diagnosis not present

## 2023-06-29 DIAGNOSIS — E063 Autoimmune thyroiditis: Secondary | ICD-10-CM | POA: Diagnosis not present

## 2023-06-29 DIAGNOSIS — Z13220 Encounter for screening for lipoid disorders: Secondary | ICD-10-CM | POA: Diagnosis not present

## 2023-06-29 DIAGNOSIS — E782 Mixed hyperlipidemia: Secondary | ICD-10-CM | POA: Diagnosis not present

## 2023-06-29 DIAGNOSIS — D170 Benign lipomatous neoplasm of skin and subcutaneous tissue of head, face and neck: Secondary | ICD-10-CM | POA: Diagnosis not present

## 2023-06-29 DIAGNOSIS — Z0001 Encounter for general adult medical examination with abnormal findings: Secondary | ICD-10-CM | POA: Diagnosis not present

## 2023-06-29 DIAGNOSIS — E7849 Other hyperlipidemia: Secondary | ICD-10-CM | POA: Diagnosis not present

## 2023-06-29 DIAGNOSIS — K219 Gastro-esophageal reflux disease without esophagitis: Secondary | ICD-10-CM | POA: Diagnosis not present

## 2023-07-03 ENCOUNTER — Other Ambulatory Visit (HOSPITAL_COMMUNITY): Payer: Self-pay | Admitting: Family Medicine

## 2023-07-03 DIAGNOSIS — F17201 Nicotine dependence, unspecified, in remission: Secondary | ICD-10-CM

## 2023-07-14 ENCOUNTER — Telehealth (INDEPENDENT_AMBULATORY_CARE_PROVIDER_SITE_OTHER): Payer: Self-pay | Admitting: Otolaryngology

## 2023-07-14 NOTE — Telephone Encounter (Signed)
Address given at time appt was made; tlt

## 2023-07-17 ENCOUNTER — Encounter (INDEPENDENT_AMBULATORY_CARE_PROVIDER_SITE_OTHER): Payer: Self-pay | Admitting: Otolaryngology

## 2023-07-17 ENCOUNTER — Ambulatory Visit (INDEPENDENT_AMBULATORY_CARE_PROVIDER_SITE_OTHER): Payer: Medicare PPO | Admitting: Otolaryngology

## 2023-07-17 VITALS — BP 176/80 | HR 78 | Ht 62.0 in | Wt 190.0 lb

## 2023-07-17 DIAGNOSIS — R221 Localized swelling, mass and lump, neck: Secondary | ICD-10-CM

## 2023-07-17 DIAGNOSIS — K219 Gastro-esophageal reflux disease without esophagitis: Secondary | ICD-10-CM | POA: Diagnosis not present

## 2023-07-17 DIAGNOSIS — Z87891 Personal history of nicotine dependence: Secondary | ICD-10-CM | POA: Diagnosis not present

## 2023-07-17 DIAGNOSIS — D869 Sarcoidosis, unspecified: Secondary | ICD-10-CM | POA: Diagnosis not present

## 2023-07-17 DIAGNOSIS — K118 Other diseases of salivary glands: Secondary | ICD-10-CM | POA: Diagnosis not present

## 2023-07-17 DIAGNOSIS — R1319 Other dysphagia: Secondary | ICD-10-CM | POA: Diagnosis not present

## 2023-07-17 NOTE — Progress Notes (Unsigned)
 `ENT CONSULT:  Reason for Consult: left neck mass    HPI: Discussed the use of AI scribe software for clinical note transcription with the patient, who gave verbal consent to proceed.  History of Present Illness   Brooke Herring is a 68 year old female hx of OSA (non-compliant with CPAP), GERD, on PPI, who presents with a persistent left neck mass near angle of the mandible x 6-7 yrs.  She has had a lump in her neck for approximately six to seven years. The lump was previously imaged and identified as a "non-cancerous tumor." Surgery was initially suggested but deferred for six months. She wants the mass removed due to occasional discomfort when lying in certain positions. No numbness, tingling, or facial weakness is present. There is no history of biopsy, but she recalls undergoing special scans in the past.  She has a history of sarcoidosis. Her sarcoidosis manifests as lumps in her arms and legs, which appear and disappear over time. She does not take any medication for sarcoidosis and reports no involvement of her lungs or other organs.  She underwent an upper endoscopy in 2023 to address difficulty swallowing, where food would sometimes get stuck. A colonoscopy revealed one polyp, but no further colonoscopy is needed for another three to four years.  She is a former smoker, having quit smoking 11 to 12 years ago. Denies dysphagia currently. She denies hx of head and neck or skin cancer.     Records Reviewed:  GI note from 08/04/21 [name] is a 68 y.o. female here for follow-up of constipation and GERD.  Historically, has done well with the pantoprazole 40 mg daily.  She typically takes the medication after she eats lunch.  Notes notices nocturnal breakthrough symptoms no dysphagia.  EGD 2013 elsewhere demonstrated normal esophagus.  Colonoscopy last year yielded a adenomatous polyp; due for surveillance 4 years from now.  She remains obese.  Has chronic constipation may go 4 days without  a bowel movement takes variety of over-the-counter agents.  Fairly frequently she has to take an enema to have a bowel movement.  She is not on a regular regimen.   Previously diagnosed with sleep apnea and remains untreated because  -  she did not like the mask.  Impression/Plan: 68 year old lady with suboptimally controlled GERD and poorly controlled constipation.  Untreated sleep apnea.  Needs optimization of pantoprazole administration throughout the day.  I discussed the importance of taking this medication 30 minutes to an hour before she eats.  Its okay to take it before her midday meal but it needs to be administered ahead of the meal.   Constipation without any alarm features.  She ought to be on regular therapy.  No alarm symptoms.  Recent colonoscopy findings reassuring.  Previously diagnosed sleep apnea-untreated.  Discussed how this disorder may be negatively impacting her health globally.    Past Medical History:  Diagnosis Date   GERD (gastroesophageal reflux disease)    Hypothyroidism    Obesity, unspecified    OSA (obstructive sleep apnea) 04/23/2013   no cpap   Other malaise and fatigue    Sarcoidosis    Unspecified hypothyroidism     Past Surgical History:  Procedure Laterality Date   ABDOMINAL HYSTERECTOMY     COLONOSCOPY  09/2011   Dr. Bosie Clos: hyperplastic polyps, internal hemorrhoids   COLONOSCOPY WITH PROPOFOL N/A 06/25/2020   adenoma. surveillance 5 years   ESOPHAGOGASTRODUODENOSCOPY  09/2011   Dr. Bosie Clos: normal esophagus, normal stomach,  mucosa abnormality of duodenum but path negative. Negative H.pylori   ESOPHAGOGASTRODUODENOSCOPY (EGD) WITH PROPOFOL N/A 12/09/2021   Procedure: ESOPHAGOGASTRODUODENOSCOPY (EGD) WITH PROPOFOL;  Surgeon: Corbin Ade, MD;  [facility]: [facility];  Service: Endoscopy;  Laterality: N/A;  1:45pm, pt knows to arrive at 8:15 - told nothing to drink after midnight   MALONEY DILATION N/A 12/09/2021   Procedure: Elease Hashimoto  DILATION;  Surgeon: Corbin Ade, MD;  Location: [facility];  Service: Endoscopy;  Laterality: N/A;   POLYPECTOMY  06/25/2020   Procedure: POLYPECTOMY;  Surgeon: Corbin Ade, MD;  [facility]: [facility];  Service: Endoscopy;;   TONSILLECTOMY      Family History  Problem Relation Age of Onset   Colon cancer Neg Hx     Social History:  reports that she quit smoking about 12 years ago. Her smoking use included cigarettes. She has never used smokeless tobacco. She reports that she does not currently use alcohol. She reports that she does not use drugs.  Allergies:  Allergies  Allergen Reactions   Zithromax [Azithromycin] Other (See Comments)    Severe headaches.    Medications: I have reviewed the patient's current medications.  The PMH, PSH, Medications, Allergies, and SH were reviewed and updated.  ROS: Constitutional: Negative for fever, weight loss and weight gain. Cardiovascular: Negative for chest pain and dyspnea on exertion. Respiratory: Is not experiencing shortness of breath at rest. Gastrointestinal: Negative for nausea and vomiting. Neurological: Negative for headaches. Psychiatric: The patient is not nervous/anxious  Blood pressure (!) 176/80, pulse 78, height 5\' 2"  (1.575 m), weight 190 lb (86.2 kg), SpO2 98%. Body mass index is 34.75 kg/m.  PHYSICAL EXAM:  Exam: General: Well-developed, well-nourished Respiratory Respiratory effort: Equal inspiration and expiration without stridor Cardiovascular Peripheral Vascular: Warm extremities with equal color/perfusion Eyes: No nystagmus with equal extraocular motion bilaterally Neuro/Psych/Balance: Patient oriented to person, place, and time; Appropriate mood and affect; Gait is intact with no imbalance; Cranial nerves I-XII are intact Head and Face Inspection: Normocephalic and atraumatic without mass or lesion Palpation: Facial skeleton intact without bony stepoffs Salivary Glands: 2-3 cm mass at the  level of the left parotid tail, no overlying skin changes, no tenderness Facial Strength: Facial motility symmetric and full bilaterally ENT Pinna: External ear intact and fully developed External canal: Canal is patent with intact skin Tympanic Membrane: Clear and mobile External Nose: No scar or anatomic deformity Internal Nose: Septum is relatively straight on anterior rhinoscopy. No polyp, or purulence. Mucosal edema and erythema present.  Bilateral inferior turbinate hypertrophy.  Lips, Teeth, and gums: Mucosa and teeth intact and viable TMJ: No pain to palpation with full mobility Oral cavity/oropharynx: No erythema or exudate, no lesions present Neck Neck and Trachea: Midline trachea without mass or lesion Thyroid: No mass or nodularity Lymphatics: No lymphadenopathy aside from a mobile 2-3 cm mass at the level of the left parotid tail, no overlying skin changes  Procedure: none  Studies reviewed CT chest 05/25/22 IMPRESSION: 1. Lung-RADS 2, benign appearance or behavior. Continue annual screening with low-dose chest CT without contrast in 12 months. 2. Aortic atherosclerosis. 3. Mild diffuse bronchial wall thickening with very mild centrilobular and paraseptal emphysema; imaging findings suggestive of underlying COPD.  Assessment/Plan: Encounter Diagnoses  Name Primary?   Neck mass Yes   Esophageal dysphagia    Sarcoidosis    Former smoker    Parotid mass     Assessment and Plan    Parotid Gland Mass vs neck  mass left side  Long-standing parotid gland/neck mass present for 6-7 years near angle of the mandible, previously imaged with a recommendation to remove it surgically, which patient elected not to follow. No bx before. We do not have access to imaging done years ago. Occasional discomfort when lying in certain positions. No biopsy history. Differential includes benign versus malignant parotid tumor vs neck mass of other origin/neoplasm/lymphadenopathy. Imaging  and biopsy needed to determine nature and extent. Discussed potential for extensive surgery if malignant.  - Order neck CT w/con to evaluate parotid mass - Schedule follow-up to review imaging results - Plan biopsy based on imaging findings - Discuss surgical options post-biopsy  Dysphagia, hx of EGD esophageal dilation, established with GI Dysphagia managed with upper endoscopy and esophageal dilation in 2023 due to difficulty swallowing and food impaction. Currently no issues with swallowing. - Monitor for recurrence of dysphagia symptoms  Sarcoidosis Sarcoidosis affecting arms and legs with lumps, no major organ involvement per report. Currently asymptomatic and not on medication. - Monitor for new symptoms or changes   Former Smoker Quit smoking 11-12 years ago. Scheduled for lung scan tomorrow as part of routine screening. - Proceed with scheduled lung scan - Review lung scan results at follow-up  Chronic GERD  - continue Protonix 40 mg 30 min prior to breakfast  -  Reflux Gourmet after meals - diet and lifestyle changes to minimize GERD  General Health Maintenance Hx of OSA not on CPAP or other treatments for it. BMI of 34.7 - consider repeat sleep study and visit with PCP or Sleep Medicine to discuss treatment options   Follow-up - Schedule follow-up in a few weeks to review imaging results and discuss biopsy - Provide contact information for neck scan scheduling if not contacted within a few days.      Thank you for allowing me to participate in the care of this patient. Please do not hesitate to contact me with any questions or concerns.   Ashok Croon, MD Otolaryngology [facility] Phone: [phone] Fax: [phone]    07/18/2023, 6:37 AM

## 2023-07-18 ENCOUNTER — Ambulatory Visit (HOSPITAL_COMMUNITY)
Admission: RE | Admit: 2023-07-18 | Discharge: 2023-07-18 | Disposition: A | Discharge status: Home / Self Care | Payer: Medicare PPO | Source: Ambulatory Visit | Attending: Family Medicine | Admitting: Family Medicine

## 2023-07-18 ENCOUNTER — Ambulatory Visit: Payer: Medicare PPO | Admitting: Internal Medicine

## 2023-07-18 DIAGNOSIS — Z122 Encounter for screening for malignant neoplasm of respiratory organs: Secondary | ICD-10-CM | POA: Diagnosis not present

## 2023-07-18 DIAGNOSIS — I700 Atherosclerosis of aorta: Secondary | ICD-10-CM | POA: Insufficient documentation

## 2023-07-18 DIAGNOSIS — F17211 Nicotine dependence, cigarettes, in remission: Secondary | ICD-10-CM | POA: Insufficient documentation

## 2023-07-18 DIAGNOSIS — J439 Emphysema, unspecified: Secondary | ICD-10-CM | POA: Insufficient documentation

## 2023-07-18 DIAGNOSIS — Z87891 Personal history of nicotine dependence: Secondary | ICD-10-CM | POA: Diagnosis not present

## 2023-07-18 DIAGNOSIS — F17201 Nicotine dependence, unspecified, in remission: Secondary | ICD-10-CM | POA: Insufficient documentation

## 2023-07-19 DIAGNOSIS — H40022 Open angle with borderline findings, high risk, left eye: Secondary | ICD-10-CM | POA: Diagnosis not present

## 2023-07-19 DIAGNOSIS — H401111 Primary open-angle glaucoma, right eye, mild stage: Secondary | ICD-10-CM | POA: Diagnosis not present

## 2023-07-19 DIAGNOSIS — H2513 Age-related nuclear cataract, bilateral: Secondary | ICD-10-CM | POA: Diagnosis not present

## 2023-07-27 DIAGNOSIS — H401111 Primary open-angle glaucoma, right eye, mild stage: Secondary | ICD-10-CM | POA: Diagnosis not present

## 2023-07-27 DIAGNOSIS — H2511 Age-related nuclear cataract, right eye: Secondary | ICD-10-CM | POA: Diagnosis not present

## 2023-07-28 ENCOUNTER — Ambulatory Visit: Payer: Medicare PPO | Admitting: Internal Medicine

## 2023-08-08 ENCOUNTER — Encounter: Payer: Self-pay | Admitting: Internal Medicine

## 2023-08-08 ENCOUNTER — Ambulatory Visit (INDEPENDENT_AMBULATORY_CARE_PROVIDER_SITE_OTHER): Payer: Medicare PPO | Admitting: Internal Medicine

## 2023-08-08 VITALS — BP 153/83 | HR 61 | Temp 98.0°F | Ht 62.0 in | Wt 193.4 lb

## 2023-08-08 DIAGNOSIS — R197 Diarrhea, unspecified: Secondary | ICD-10-CM | POA: Diagnosis not present

## 2023-08-08 DIAGNOSIS — A0472 Enterocolitis due to Clostridium difficile, not specified as recurrent: Secondary | ICD-10-CM

## 2023-08-08 DIAGNOSIS — Z860100 Personal history of colon polyps, unspecified: Secondary | ICD-10-CM | POA: Diagnosis not present

## 2023-08-08 DIAGNOSIS — R1310 Dysphagia, unspecified: Secondary | ICD-10-CM

## 2023-08-08 DIAGNOSIS — K219 Gastro-esophageal reflux disease without esophagitis: Secondary | ICD-10-CM

## 2023-08-08 NOTE — Progress Notes (Signed)
 Primary Care Physician:  Assunta Found, MD Primary Gastroenterologist:  Dr. Jena Gauss  Pre-Procedure History & Physical: HPI:  Brooke Herring is a 68 y.o. female here for follow-up of C. difficile related diarrhea.  She completed vancomycin.  Diarrhea has resolved bowel function back to normal for several weeks now.  Clorox cleaning the bathroom good handwashing to small family members.  She is happy and back to baseline.  She is due for a surveillance colonoscopy history of polyps 2029 GERD well-controlled on Protonix once daily no dysphagia.  Past Medical History:  Diagnosis Date   GERD (gastroesophageal reflux disease)    Hypothyroidism    Obesity, unspecified    OSA (obstructive sleep apnea) 04/23/2013   no cpap   Other malaise and fatigue    Sarcoidosis    Unspecified hypothyroidism     Past Surgical History:  Procedure Laterality Date   ABDOMINAL HYSTERECTOMY     COLONOSCOPY  09/2011   Dr. Bosie Clos: hyperplastic polyps, internal hemorrhoids   COLONOSCOPY WITH PROPOFOL N/A 06/25/2020   adenoma. surveillance 5 years   ESOPHAGOGASTRODUODENOSCOPY  09/2011   Dr. Bosie Clos: normal esophagus, normal stomach, mucosa abnormality of duodenum but path negative. Negative H.pylori   ESOPHAGOGASTRODUODENOSCOPY (EGD) WITH PROPOFOL N/A 12/09/2021   Procedure: ESOPHAGOGASTRODUODENOSCOPY (EGD) WITH PROPOFOL;  Surgeon: Corbin Ade, MD;  [facility];  Service: Endoscopy;  Laterality: N/A;  1:45pm, pt knows to arrive at 8:15 - told nothing to drink after midnight   MALONEY DILATION N/A 12/09/2021   Procedure: Elease Hashimoto DILATION;  Surgeon: Corbin Ade, MD;  [facility];  Service: Endoscopy;  Laterality: N/A;   POLYPECTOMY  06/25/2020   Procedure: POLYPECTOMY;  Surgeon: Corbin Ade, MD;  [facility];  Service: Endoscopy;;   TONSILLECTOMY      Prior to Admission medications   Medication Sig Start Date End Date Taking? Authorizing Provider  acetaminophen  (TYLENOL) 500 MG tablet Take 500-1,000 mg by mouth every 6 (six) hours as needed (for pain.).   Yes [provider]  latanoprost (XALATAN) 0.005 % ophthalmic solution 1 drop at bedtime. 06/14/23  Yes [provider]  levothyroxine (SYNTHROID) 100 MCG tablet Take 100 mcg by mouth daily at 6 (six) AM.   Yes [provider]  pantoprazole (PROTONIX) 40 MG tablet TAKE 1 TABLET BY MOUTH TWICE DAILY BEFORE A MEAL 03/02/23  Yes Tiffany Kocher, PA-C  rosuvastatin (CRESTOR) 5 MG tablet SMARTSIG:1 Tablet(s) By Mouth Every Evening 06/03/22  Yes [provider]    Allergies as of 08/08/2023 - Review Complete 08/08/2023  Allergen Reaction Noted   Zithromax [azithromycin] Other (See Comments) 04/23/2013    Family History  Problem Relation Age of Onset   Colon cancer Neg Hx     Social History   Socioeconomic History   Marital status: Married    Spouse name: Not on file   Number of children: Not on file   Years of education: Not on file   Highest education level: Not on file  Occupational History   Occupation: housewife  Tobacco Use   Smoking status: Former    Current packs/day: 0.00    Types: Cigarettes    Quit date: 04/24/2011    Years since quitting: 12.2   Smokeless tobacco: Never  Vaping Use   Vaping status: Never Used  Substance and Sexual Activity   Alcohol use: Not Currently   Drug use: No   Sexual activity: Not Currently  Other Topics Concern  Not on file  Social History Narrative   Not on file   Social Drivers of Health   Financial Resource Strain: Not on file  Food Insecurity: Not on file  Transportation Needs: Not on file  Physical Activity: Not on file  Stress: Not on file  Social Connections: Not on file  Intimate Partner Violence: Not on file    Review of Systems: See HPI, otherwise negative ROS  Physical Exam: BP (!) 153/83 (BP Location: Right Arm, Patient Position: Sitting, Cuff Size: Normal)   Pulse 61   Temp 98 F (36.7  C) (Oral)   Ht 5\' 2"  (1.575 m)   Wt 193 lb 6.4 oz (87.7 kg)   SpO2 95%   BMI 35.37 kg/m  General:   Alert,  Well-developed, well-nourished, pleasant and cooperative in NAD Abdomen: Non-distended, normal bowel sounds.  Soft and nontender without appreciable mass or hepatosplenomegaly.   Impression/Plan: 68 year old lady with recent protracted nonbloody diarrheal illness.  C. difficile found in her stool.  She responded to vancomycin.  Clinically doing well without recurrent diarrhea.  History of colonic adenoma  GERD-well-controlled on Protonix once daily.  Recommendations:  Continue Protonix 40 mg before meal daily  Plan for colonoscopy 2029-history of polyps  Continue to Clorox clean  bathroom and good handwashing practices are recommended for all family members  Office visit here in 1 year and as needed.     Notice: This dictation was prepared with Dragon dictation along with smaller phrase technology. Any transcriptional errors that result from this process are unintentional and may not be corrected upon review.

## 2023-08-08 NOTE — Patient Instructions (Signed)
 It was nice to see you again today!  Continue Protonix 40 mg before meal daily  Plan for colonoscopy 2029-history of polyps  Continue to Clorox clean your bathroom and good handwashing practices are recommended for all family members  Office visit here in 1 year and as needed.

## 2023-08-12 ENCOUNTER — Ambulatory Visit (HOSPITAL_BASED_OUTPATIENT_CLINIC_OR_DEPARTMENT_OTHER)
Admission: RE | Admit: 2023-08-12 | Discharge: 2023-08-12 | Disposition: A | Discharge status: Home / Self Care | Source: Ambulatory Visit | Attending: Otolaryngology | Admitting: Otolaryngology

## 2023-08-12 DIAGNOSIS — R221 Localized swelling, mass and lump, neck: Secondary | ICD-10-CM | POA: Diagnosis not present

## 2023-08-12 DIAGNOSIS — K111 Hypertrophy of salivary gland: Secondary | ICD-10-CM | POA: Diagnosis not present

## 2023-08-12 DIAGNOSIS — K118 Other diseases of salivary glands: Secondary | ICD-10-CM | POA: Diagnosis not present

## 2023-08-12 DIAGNOSIS — M47812 Spondylosis without myelopathy or radiculopathy, cervical region: Secondary | ICD-10-CM | POA: Diagnosis not present

## 2023-08-12 MED ORDER — IOHEXOL 300 MG/ML  SOLN
75.0000 mL | Freq: Once | INTRAMUSCULAR | Status: AC | PRN
  Administered 2023-08-12: 75 mL via INTRAVENOUS

## 2023-08-24 ENCOUNTER — Other Ambulatory Visit (INDEPENDENT_AMBULATORY_CARE_PROVIDER_SITE_OTHER): Payer: Self-pay | Admitting: Otolaryngology

## 2023-08-24 DIAGNOSIS — K118 Other diseases of salivary glands: Secondary | ICD-10-CM

## 2023-08-24 NOTE — Progress Notes (Signed)
 FNA of parotid mass needed

## 2023-08-25 NOTE — Progress Notes (Signed)
 Gilmer Mor, DO  Claudean Kinds; P Ir Procedure Requests OK for US guided FNA of left parotid mass.  [address]       Previous Messages    ----- Message ----- From: Claudean Kinds Sent: 08/25/2023   8:46 AM EDT To: Claudean Kinds; Ir Procedure Requests Subject: [place] FNA BIOPSY SALIVARY GLAND PAROTID GLAND E*  Procedure : [place] FNA BIOPSY SALIVARY GLAND PAROTID GLAND EA ADDT'L LESION  Reason : left parotid mass Dx: Mass of left parotid gland [K11.8 (ICD-10-CM)]    History : CT soft tissue neck w/ , CT chest lunch cancer screening  Provider : Ashok Croon, MD  Provider contact :  [phone]

## 2023-08-28 ENCOUNTER — Ambulatory Visit (INDEPENDENT_AMBULATORY_CARE_PROVIDER_SITE_OTHER): Payer: Medicare PPO | Admitting: Otolaryngology

## 2023-09-19 ENCOUNTER — Other Ambulatory Visit: Payer: Self-pay | Admitting: Radiology

## 2023-09-20 ENCOUNTER — Other Ambulatory Visit (INDEPENDENT_AMBULATORY_CARE_PROVIDER_SITE_OTHER): Payer: Self-pay | Admitting: Otolaryngology

## 2023-09-20 ENCOUNTER — Other Ambulatory Visit: Payer: Self-pay

## 2023-09-20 ENCOUNTER — Ambulatory Visit (HOSPITAL_COMMUNITY)
Admission: RE | Admit: 2023-09-20 | Discharge: 2023-09-20 | Disposition: A | Discharge status: Home / Self Care | Source: Ambulatory Visit | Attending: Otolaryngology | Admitting: Otolaryngology

## 2023-09-20 DIAGNOSIS — K118 Other diseases of salivary glands: Secondary | ICD-10-CM | POA: Diagnosis not present

## 2023-09-20 DIAGNOSIS — D37030 Neoplasm of uncertain behavior of the parotid salivary glands: Secondary | ICD-10-CM | POA: Diagnosis not present

## 2023-09-20 DIAGNOSIS — D110 Benign neoplasm of parotid gland: Secondary | ICD-10-CM | POA: Diagnosis not present

## 2023-09-20 MED ORDER — LIDOCAINE HCL (PF) 1 % IJ SOLN
3.0000 mL | Freq: Once | INTRAMUSCULAR | Status: AC
  Administered 2023-09-20: 3 mL via INTRADERMAL

## 2023-09-20 NOTE — Procedures (Signed)
 Interventional Radiology Procedure:   Indications: Indeterminate left parotid nodule  Procedure:  US  guided FNA of left parotid nodule  Findings: 6 FNAs of left parotid nodule.  Complications: None     EBL: Minimal  Plan: Discharge to home  Cate Oravec R. Julietta Ogren, MD  Pager: [phone]

## 2023-09-21 ENCOUNTER — Telehealth (INDEPENDENT_AMBULATORY_CARE_PROVIDER_SITE_OTHER): Payer: Self-pay

## 2023-09-21 LAB — CYTOLOGY - NON PAP

## 2023-09-21 NOTE — Telephone Encounter (Signed)
 Tried to leave a message with biopsy results, patients number is not in service, will try and send my chart message

## 2023-09-26 ENCOUNTER — Other Ambulatory Visit (INDEPENDENT_AMBULATORY_CARE_PROVIDER_SITE_OTHER): Payer: Self-pay | Admitting: Otolaryngology

## 2023-09-26 ENCOUNTER — Telehealth (INDEPENDENT_AMBULATORY_CARE_PROVIDER_SITE_OTHER): Payer: Self-pay

## 2023-09-26 DIAGNOSIS — D110 Benign neoplasm of parotid gland: Secondary | ICD-10-CM

## 2023-09-26 NOTE — Telephone Encounter (Signed)
 Left a message on husbands phone, the wife's # is not in service.  I asked him to please have the patient call to set up her pre-op and surgery.

## 2023-11-09 ENCOUNTER — Encounter (INDEPENDENT_AMBULATORY_CARE_PROVIDER_SITE_OTHER): Payer: Self-pay | Admitting: Otolaryngology

## 2023-11-09 ENCOUNTER — Ambulatory Visit (INDEPENDENT_AMBULATORY_CARE_PROVIDER_SITE_OTHER): Admitting: Otolaryngology

## 2023-11-09 VITALS — BP 141/82 | HR 72 | Ht 62.0 in | Wt 189.0 lb

## 2023-11-09 DIAGNOSIS — D110 Benign neoplasm of parotid gland: Secondary | ICD-10-CM | POA: Diagnosis not present

## 2023-11-09 DIAGNOSIS — K118 Other diseases of salivary glands: Secondary | ICD-10-CM

## 2023-11-09 NOTE — Progress Notes (Signed)
 ENT Progress Note:   Update 11/09/2023  Discussed the use of AI scribe software for clinical note transcription with the patient, who gave verbal consent to proceed.  History of Present Illness  She is here for pre-op visit before left parotidectomy for pleomorphic adenoma. No new illnesses. Not on blood thinner. No facial weakness. No paresthesias.    Records Reviewed:  Initial Evaluation  Reason for Consult: left neck mass    HPI: Discussed the use of AI scribe software for clinical note transcription with the patient, who gave verbal consent to proceed.  History of Present Illness   Brooke Herring is a 68 year old female hx of OSA (non-compliant with CPAP), GERD, on PPI, who presents with a persistent left neck mass near angle of the mandible x 6-7 yrs.  She has had a lump in her neck for approximately six to seven years. The lump was previously imaged and identified as a non-cancerous tumor. Surgery was initially suggested but deferred for six months. She wants the mass removed due to occasional discomfort when lying in certain positions. No numbness, tingling, or facial weakness is present. There is no history of biopsy, but she recalls undergoing special scans in the past.  She has a history of sarcoidosis. Her sarcoidosis manifests as lumps in her arms and legs, which appear and disappear over time. She does not take any medication for sarcoidosis and reports no involvement of her lungs or other organs.  She underwent an upper endoscopy in 2023 to address difficulty swallowing, where food would sometimes get stuck. A colonoscopy revealed one polyp, but no further colonoscopy is needed for another three to four years.  She is a former smoker, having quit smoking 11 to 12 years ago. Denies dysphagia currently. She denies hx of head and neck or skin cancer.     Records Reviewed:  GI note from 08/04/21 Brooke Herring is a 68 y.o. female here for follow-up of constipation and GERD.   Historically, has done well with the pantoprazole  40 mg daily.  She typically takes the medication after she eats lunch.  Notes notices nocturnal breakthrough symptoms no dysphagia.  EGD 2013 elsewhere demonstrated normal esophagus.  Colonoscopy last year yielded a adenomatous polyp; due for surveillance 4 years from now.  She remains obese.  Has chronic constipation may go 4 days without a bowel movement takes variety of over-the-counter agents.  Fairly frequently she has to take an enema to have a bowel movement.  She is not on a regular regimen.   Previously diagnosed with sleep apnea and remains untreated because  -  she did not like the mask.  Impression/Plan: 68 year old lady with suboptimally controlled GERD and poorly controlled constipation.  Untreated sleep apnea.  Needs optimization of pantoprazole  administration throughout the day.  I discussed the importance of taking this medication 30 minutes to an hour before she eats.  Its okay to take it before her midday meal but it needs to be administered ahead of the meal.   Constipation without any alarm features.  She ought to be on regular therapy.  No alarm symptoms.  Recent colonoscopy findings reassuring.  Previously diagnosed sleep apnea-untreated.  Discussed how this disorder may be negatively impacting her health globally.    Past Medical History:  Diagnosis Date   GERD (gastroesophageal reflux disease)    Hypothyroidism    Obesity, unspecified    OSA (obstructive sleep apnea) 04/23/2013   no cpap   Other malaise and fatigue  Sarcoidosis    Unspecified hypothyroidism     Past Surgical History:  Procedure Laterality Date   ABDOMINAL HYSTERECTOMY     COLONOSCOPY  09/2011   Dr. Honey Lusty: hyperplastic polyps, internal hemorrhoids   COLONOSCOPY WITH PROPOFOL  N/A 06/25/2020   adenoma. surveillance 5 years   ESOPHAGOGASTRODUODENOSCOPY  09/2011   Dr. Honey Lusty: normal esophagus, normal stomach, mucosa abnormality of duodenum  but path negative. Negative H.pylori   ESOPHAGOGASTRODUODENOSCOPY (EGD) WITH PROPOFOL  N/A 12/09/2021   Procedure: ESOPHAGOGASTRODUODENOSCOPY (EGD) WITH PROPOFOL ;  Surgeon: Suzette Espy, MD;  Location: [facility];  Service: Endoscopy;  Laterality: N/A;  1:45pm, pt knows to arrive at 8:15 - told nothing to drink after midnight   MALONEY DILATION N/A 12/09/2021   Procedure: Londa Rival DILATION;  Surgeon: Suzette Espy, MD;  Location: [facility];  Service: Endoscopy;  Laterality: N/A;   POLYPECTOMY  06/25/2020   Procedure: POLYPECTOMY;  Surgeon: Suzette Espy, MD;  Location: [facility];  Service: Endoscopy;;   TONSILLECTOMY      Family History  Problem Relation Age of Onset   Colon cancer Neg Hx     Social History:  reports that she quit smoking about 12 years ago. Her smoking use included cigarettes. She has never used smokeless tobacco. She reports that she does not currently use alcohol. She reports that she does not use drugs.  Allergies:  Allergies  Allergen Reactions   Zithromax [Azithromycin] Other (See Comments)    Severe headaches.    Medications: I have reviewed the patient's current medications.  The PMH, PSH, Medications, Allergies, and SH were reviewed and updated.  ROS: Constitutional: Negative for fever, weight loss and weight gain. Cardiovascular: Negative for chest pain and dyspnea on exertion. Respiratory: Is not experiencing shortness of breath at rest. Gastrointestinal: Negative for nausea and vomiting. Neurological: Negative for headaches. Psychiatric: The patient is not nervous/anxious  Blood pressure (!) 141/82, pulse 72, height 5' 2 (1.575 m), weight 189 lb (85.7 kg), SpO2 93%. Body mass index is 34.57 kg/m.  PHYSICAL EXAM:  Exam: General: Well-developed, well-nourished Respiratory Respiratory effort: Equal inspiration and expiration without stridor Cardiovascular Peripheral Vascular: Warm extremities with equal color/perfusion Eyes: No  nystagmus with equal extraocular motion bilaterally Neuro/Psych/Balance: Patient oriented to person, place, and time; Appropriate mood and affect; Gait is intact with no imbalance; Cranial nerves I-XII are intact Head and Face Inspection: Normocephalic and atraumatic without mass or lesion Palpation: Facial skeleton intact without bony stepoffs Salivary Glands: 2-3 cm mass at the level of the left parotid tail, no overlying skin changes, no tenderness Facial Strength: Facial motility symmetric and full bilaterally ENT Pinna: External ear intact and fully developed External canal: Canal is patent with intact skin Tympanic Membrane: Clear and mobile External Nose: No scar or anatomic deformity Internal Nose: Septum is relatively straight on anterior rhinoscopy. No polyp, or purulence. Mucosal edema and erythema present.  Bilateral inferior turbinate hypertrophy.  Lips, Teeth, and gums: Mucosa and teeth intact and viable TMJ: No pain to palpation with full mobility Oral cavity/oropharynx: No erythema or exudate, no lesions present Neck Neck and Trachea: Midline trachea without mass or lesion Thyroid : No mass or nodularity Lymphatics: No lymphadenopathy aside from a mobile 2-3 cm mass at the level of the left parotid tail, no overlying skin changes  Procedure: none  Studies reviewed CT chest 05/25/22 IMPRESSION: 1. Lung-RADS 2, benign appearance or behavior. Continue annual screening with low-dose chest CT without contrast in 12 months. 2. Aortic atherosclerosis. 3. Mild  diffuse bronchial wall thickening with very mild centrilobular and paraseptal emphysema; imaging findings suggestive of underlying COPD.  CT neck w/con 08/12/23 IMPRESSION: 2.9 x 19 x 17 mm mass in the inferior aspect of the superficial lobe of the left parotid gland. This has only minimally enlarged over the last 6-1/2 years. This continues to argue in favor of a benign etiology, most likely diagnosis pleomorphic  adenoma. Atypical Warthin's tumor is possible. Malignancy seems quite unlikely given the relative stability since 2018.   Assessment/Plan: Encounter Diagnoses  Name Primary?   Pleomorphic adenoma of parotid gland Yes   Mass of left parotid gland    Parotid mass      Assessment and Plan    Parotid Gland Mass vs neck mass left side  Long-standing parotid gland/neck mass present for 6-7 years near angle of the mandible, previously imaged with a recommendation to remove it surgically, which patient elected not to follow. No bx before. We do not have access to imaging done years ago. Occasional discomfort when lying in certain positions. No biopsy history. Differential includes benign versus malignant parotid tumor vs neck mass of other origin/neoplasm/lymphadenopathy. Imaging and biopsy needed to determine nature and extent. Discussed potential for extensive surgery if malignant.  - Order neck CT w/con to evaluate parotid mass - Schedule follow-up to review imaging results - Plan biopsy based on imaging findings - Discuss surgical options post-biopsy  Dysphagia, hx of EGD esophageal dilation, established with GI Dysphagia managed with upper endoscopy and esophageal dilation in 2023 due to difficulty swallowing and food impaction. Currently no issues with swallowing. - Monitor for recurrence of dysphagia symptoms  Sarcoidosis Sarcoidosis affecting arms and legs with lumps, no major organ involvement per report. Currently asymptomatic and not on medication. - Monitor for new symptoms or changes   Former Smoker Quit smoking 11-12 years ago. Scheduled for lung scan tomorrow as part of routine screening. - Proceed with scheduled lung scan - Review lung scan results at follow-up  Chronic GERD  - continue Protonix  40 mg 30 min prior to breakfast  -  Reflux Gourmet after meals - diet and lifestyle changes to minimize GERD  General Health Maintenance Hx of OSA not on CPAP or other  treatments for it. BMI of 34.7 - consider repeat sleep study and visit with PCP or Sleep Medicine to discuss treatment options   Follow-up - Schedule follow-up in a few weeks to review imaging results and discuss biopsy - Provide contact information for neck scan scheduling if not contacted within a few days.     Update 11/09/2023 Assessment and Plan Assessment & Plan Left parotid gland mass, pleomorphic adenoma on FNA - risks and benefits of superficial parotidectomy and she would like to proceed  Tinisha Etzkorn, MD Otolaryngology [facility] Phone: [phone] Fax: [phone]    11/09/2023, 4:12 PM

## 2023-11-09 NOTE — H&P (View-Only) (Signed)
 ENT Progress Note:   Update 11/09/2023  Discussed the use of AI scribe software for clinical note transcription with the patient, who gave verbal consent to proceed.  History of Present Illness  She is here for pre-op visit before left parotidectomy for pleomorphic adenoma. No new illnesses. Not on blood thinner. No facial weakness. No paresthesias.    Records Reviewed:  Initial Evaluation  Reason for Consult: left neck mass    HPI: Discussed the use of AI scribe software for clinical note transcription with the patient, who gave verbal consent to proceed.  History of Present Illness   Brooke Herring is a 68 year old female hx of OSA (non-compliant with CPAP), GERD, on PPI, who presents with a persistent left neck mass near angle of the mandible x 6-7 yrs.  She has had a lump in her neck for approximately six to seven years. The lump was previously imaged and identified as a non-cancerous tumor. Surgery was initially suggested but deferred for six months. She wants the mass removed due to occasional discomfort when lying in certain positions. No numbness, tingling, or facial weakness is present. There is no history of biopsy, but she recalls undergoing special scans in the past.  She has a history of sarcoidosis. Her sarcoidosis manifests as lumps in her arms and legs, which appear and disappear over time. She does not take any medication for sarcoidosis and reports no involvement of her lungs or other organs.  She underwent an upper endoscopy in 2023 to address difficulty swallowing, where food would sometimes get stuck. A colonoscopy revealed one polyp, but no further colonoscopy is needed for another three to four years.  She is a former smoker, having quit smoking 11 to 12 years ago. Denies dysphagia currently. She denies hx of head and neck or skin cancer.     Records Reviewed:  GI note from 08/04/21 Brooke Herring is a 68 y.o. female here for follow-up of constipation and GERD.   Historically, has done well with the pantoprazole  40 mg daily.  She typically takes the medication after she eats lunch.  Notes notices nocturnal breakthrough symptoms no dysphagia.  EGD 2013 elsewhere demonstrated normal esophagus.  Colonoscopy last year yielded a adenomatous polyp; due for surveillance 4 years from now.  She remains obese.  Has chronic constipation may go 4 days without a bowel movement takes variety of over-the-counter agents.  Fairly frequently she has to take an enema to have a bowel movement.  She is not on a regular regimen.   Previously diagnosed with sleep apnea and remains untreated because  -  she did not like the mask.  Impression/Plan: 68 year old lady with suboptimally controlled GERD and poorly controlled constipation.  Untreated sleep apnea.  Needs optimization of pantoprazole  administration throughout the day.  I discussed the importance of taking this medication 30 minutes to an hour before she eats.  Its okay to take it before her midday meal but it needs to be administered ahead of the meal.   Constipation without any alarm features.  She ought to be on regular therapy.  No alarm symptoms.  Recent colonoscopy findings reassuring.  Previously diagnosed sleep apnea-untreated.  Discussed how this disorder may be negatively impacting her health globally.    Past Medical History:  Diagnosis Date   GERD (gastroesophageal reflux disease)    Hypothyroidism    Obesity, unspecified    OSA (obstructive sleep apnea) 04/23/2013   no cpap   Other malaise and fatigue  Sarcoidosis    Unspecified hypothyroidism     Past Surgical History:  Procedure Laterality Date   ABDOMINAL HYSTERECTOMY     COLONOSCOPY  09/2011   Dr. Honey Lusty: hyperplastic polyps, internal hemorrhoids   COLONOSCOPY WITH PROPOFOL  N/A 06/25/2020   adenoma. surveillance 5 years   ESOPHAGOGASTRODUODENOSCOPY  09/2011   Dr. Honey Lusty: normal esophagus, normal stomach, mucosa abnormality of duodenum  but path negative. Negative H.pylori   ESOPHAGOGASTRODUODENOSCOPY (EGD) WITH PROPOFOL  N/A 12/09/2021   Procedure: ESOPHAGOGASTRODUODENOSCOPY (EGD) WITH PROPOFOL ;  Surgeon: Suzette Espy, MD;  Location: [facility];  Service: Endoscopy;  Laterality: N/A;  1:45pm, pt knows to arrive at 8:15 - told nothing to drink after midnight   MALONEY DILATION N/A 12/09/2021   Procedure: Londa Rival DILATION;  Surgeon: Suzette Espy, MD;  [facility]: [facility];  Service: Endoscopy;  Laterality: N/A;   POLYPECTOMY  06/25/2020   Procedure: POLYPECTOMY;  Surgeon: Suzette Espy, MD;  [facility]: [facility];  Service: Endoscopy;;   TONSILLECTOMY      Family History  Problem Relation Age of Onset   Colon cancer Neg Hx     Social History:  reports that she quit smoking about 12 years ago. Her smoking use included cigarettes. She has never used smokeless tobacco. She reports that she does not currently use alcohol. She reports that she does not use drugs.  Allergies:  Allergies  Allergen Reactions   Zithromax [Azithromycin] Other (See Comments)    Severe headaches.    Medications: I have reviewed the patient's current medications.  The PMH, PSH, Medications, Allergies, and SH were reviewed and updated.  ROS: Constitutional: Negative for fever, weight loss and weight gain. Cardiovascular: Negative for chest pain and dyspnea on exertion. Respiratory: Is not experiencing shortness of breath at rest. Gastrointestinal: Negative for nausea and vomiting. Neurological: Negative for headaches. Psychiatric: The patient is not nervous/anxious  Blood pressure (!) 141/82, pulse 72, height 5' 2 (1.575 m), weight 189 lb (85.7 kg), SpO2 93%. Body mass index is 34.57 kg/m.  PHYSICAL EXAM:  Exam: General: Well-developed, well-nourished Respiratory Respiratory effort: Equal inspiration and expiration without stridor Cardiovascular Peripheral Vascular: Warm extremities with equal color/perfusion Eyes: No  nystagmus with equal extraocular motion bilaterally Neuro/Psych/Balance: Patient oriented to person, place, and time; Appropriate mood and affect; Gait is intact with no imbalance; Cranial nerves I-XII are intact Head and Face Inspection: Normocephalic and atraumatic without mass or lesion Palpation: Facial skeleton intact without bony stepoffs Salivary Glands: 2-3 cm mass at the level of the left parotid tail, no overlying skin changes, no tenderness Facial Strength: Facial motility symmetric and full bilaterally ENT Pinna: External ear intact and fully developed External canal: Canal is patent with intact skin Tympanic Membrane: Clear and mobile External Nose: No scar or anatomic deformity Internal Nose: Septum is relatively straight on anterior rhinoscopy. No polyp, or purulence. Mucosal edema and erythema present.  Bilateral inferior turbinate hypertrophy.  Lips, Teeth, and gums: Mucosa and teeth intact and viable TMJ: No pain to palpation with full mobility Oral cavity/oropharynx: No erythema or exudate, no lesions present Neck Neck and Trachea: Midline trachea without mass or lesion Thyroid : No mass or nodularity Lymphatics: No lymphadenopathy aside from a mobile 2-3 cm mass at the level of the left parotid tail, no overlying skin changes  Procedure: none  Studies reviewed CT chest 05/25/22 IMPRESSION: 1. Lung-RADS 2, benign appearance or behavior. Continue annual screening with low-dose chest CT without contrast in 12 months. 2. Aortic atherosclerosis. 3. Mild  diffuse bronchial wall thickening with very mild centrilobular and paraseptal emphysema; imaging findings suggestive of underlying COPD.  CT neck w/con 08/12/23 IMPRESSION: 2.9 x 19 x 17 mm mass in the inferior aspect of the superficial lobe of the left parotid gland. This has only minimally enlarged over the last 6-1/2 years. This continues to argue in favor of a benign etiology, most likely diagnosis pleomorphic  adenoma. Atypical Warthin's tumor is possible. Malignancy seems quite unlikely given the relative stability since 2018.   Assessment/Plan: Encounter Diagnoses  Name Primary?   Pleomorphic adenoma of parotid gland Yes   Mass of left parotid gland    Parotid mass      Assessment and Plan    Parotid Gland Mass vs neck mass left side  Long-standing parotid gland/neck mass present for 6-7 years near angle of the mandible, previously imaged with a recommendation to remove it surgically, which patient elected not to follow. No bx before. We do not have access to imaging done years ago. Occasional discomfort when lying in certain positions. No biopsy history. Differential includes benign versus malignant parotid tumor vs neck mass of other origin/neoplasm/lymphadenopathy. Imaging and biopsy needed to determine nature and extent. Discussed potential for extensive surgery if malignant.  - Order neck CT w/con to evaluate parotid mass - Schedule follow-up to review imaging results - Plan biopsy based on imaging findings - Discuss surgical options post-biopsy  Dysphagia, hx of EGD esophageal dilation, established with GI Dysphagia managed with upper endoscopy and esophageal dilation in 2023 due to difficulty swallowing and food impaction. Currently no issues with swallowing. - Monitor for recurrence of dysphagia symptoms  Sarcoidosis Sarcoidosis affecting arms and legs with lumps, no major organ involvement per report. Currently asymptomatic and not on medication. - Monitor for new symptoms or changes   Former Smoker Quit smoking 11-12 years ago. Scheduled for lung scan tomorrow as part of routine screening. - Proceed with scheduled lung scan - Review lung scan results at follow-up  Chronic GERD  - continue Protonix  40 mg 30 min prior to breakfast  -  Reflux Gourmet after meals - diet and lifestyle changes to minimize GERD  General Health Maintenance Hx of OSA not on CPAP or other  treatments for it. BMI of 34.7 - consider repeat sleep study and visit with PCP or Sleep Medicine to discuss treatment options   Follow-up - Schedule follow-up in a few weeks to review imaging results and discuss biopsy - Provide contact information for neck scan scheduling if not contacted within a few days.     Update 11/09/2023 Assessment and Plan Assessment & Plan Left parotid gland mass, pleomorphic adenoma on FNA - risks and benefits of superficial parotidectomy and she would like to proceed  Tinisha Etzkorn, MD Otolaryngology [facility] Phone: [phone] Fax: [phone]    11/09/2023, 4:12 PM

## 2023-11-10 NOTE — Pre-Procedure Instructions (Signed)
 Surgical Instructions   Your procedure is scheduled on November 22, 2023. Report to [facility] Main Entrance A at 6:15 A.M., then check in with the Admitting office. Any questions or running late day of surgery: call [phone]  Questions prior to your surgery date: call [phone], Monday-Friday, 8am-4pm. If you experience any cold or flu symptoms such as cough, fever, chills, shortness of breath, etc. between now and your scheduled surgery, please notify us  at the above number.     Remember:  Do not eat after midnight the night before your surgery   You may drink clear liquids until 5:15 AM the morning of your surgery.   Clear liquids allowed are: Water, Non-Citrus Juices (without pulp), Carbonated Beverages, Clear Tea (no milk, honey, etc.), Black Coffee Only (NO MILK, CREAM OR POWDERED CREAMER of any kind), and Gatorade.    Take these medicines the morning of surgery with A SIP OF WATER: levothyroxine (SYNTHROID)  loratadine (CLARITIN)    May take these medicines IF NEEDED: pantoprazole  (PROTONIX )    One week prior to surgery, STOP taking any Aspirin (unless otherwise instructed by your surgeon) Aleve, Naproxen, Ibuprofen, Motrin, Advil, Goody's, BC's, all herbal medications, fish oil, and non-prescription vitamins.                     Do NOT Smoke (Tobacco/Vaping) for 24 hours prior to your procedure.  If you use a CPAP at night, you may bring your mask/headgear for your overnight stay.   You will be asked to remove any contacts, glasses, piercing's, hearing aid's, dentures/partials prior to surgery. Please bring cases for these items if needed.    Patients discharged the day of surgery will not be allowed to drive home, and someone needs to stay with them for 24 hours.  SURGICAL WAITING ROOM VISITATION Patients may have no more than 2 support people in the waiting area - these visitors may rotate.   Pre-op nurse will coordinate an appropriate time for 1 ADULT support  person, who may not rotate, to accompany patient in pre-op.  Children under the age of 31 must have an adult with them who is not the patient and must remain in the main waiting area with an adult.  If the patient needs to stay at the hospital during part of their recovery, the visitor guidelines for inpatient rooms apply.  Please refer to the [facility] website for the visitor guidelines for any additional information.   If you received a COVID test during your pre-op visit  it is requested that you wear a mask when out in public, stay away from anyone that may not be feeling well and notify your surgeon if you develop symptoms. If you have been in contact with anyone that has tested positive in the last 10 days please notify you surgeon.      Pre-operative CHG Bathing Instructions   You can play a key role in reducing the risk of infection after surgery. Your skin needs to be as free of germs as possible. You can reduce the number of germs on your skin by washing with CHG (chlorhexidine gluconate) soap before surgery. CHG is an antiseptic soap that kills germs and continues to kill germs even after washing.   DO NOT use if you have an allergy to chlorhexidine/CHG or antibacterial soaps. If your skin becomes reddened or irritated, stop using the CHG and notify one of our RNs at [phone].  TAKE A SHOWER THE NIGHT BEFORE SURGERY AND THE DAY OF SURGERY    Please keep in mind the following:  DO NOT shave, including legs and underarms, 48 hours prior to surgery.   You may shave your face before/day of surgery.  Place clean sheets on your bed the night before surgery Use a clean washcloth (not used since being washed) for each shower. DO NOT sleep with pet's night before surgery.  CHG Shower Instructions:  Wash your face and private area with normal soap. If you choose to wash your hair, wash first with your normal shampoo.  After you use shampoo/soap, rinse your hair and  body thoroughly to remove shampoo/soap residue.  Turn the water OFF and apply half the bottle of CHG soap to a CLEAN washcloth.  Apply CHG soap ONLY FROM YOUR NECK DOWN TO YOUR TOES (washing for 3-5 minutes)  DO NOT use CHG soap on face, private areas, open wounds, or sores.  Pay special attention to the area where your surgery is being performed.  If you are having back surgery, having someone wash your back for you may be helpful. Wait 2 minutes after CHG soap is applied, then you may rinse off the CHG soap.  Pat dry with a clean towel  Put on clean pajamas    Additional instructions for the day of surgery: DO NOT APPLY any lotions, deodorants, cologne, or perfumes.   Do not wear jewelry or makeup Do not wear nail polish, gel polish, artificial nails, or any other type of covering on natural nails (fingers and toes) Do not bring valuables to the hospital. [facility] is not responsible for valuables/personal belongings. Put on clean/comfortable clothes.  Please brush your teeth.  Ask your nurse before applying any prescription medications to the skin.

## 2023-11-13 ENCOUNTER — Other Ambulatory Visit: Payer: Self-pay

## 2023-11-13 ENCOUNTER — Encounter (HOSPITAL_COMMUNITY): Payer: Self-pay

## 2023-11-13 ENCOUNTER — Encounter (HOSPITAL_COMMUNITY)
Admission: RE | Admit: 2023-11-13 | Discharge: 2023-11-13 | Disposition: A | Discharge status: Home / Self Care | Source: Ambulatory Visit | Attending: Otolaryngology | Admitting: Otolaryngology

## 2023-11-13 VITALS — BP 166/73 | HR 68 | Temp 98.1°F | Resp 17 | Ht 62.0 in | Wt 193.2 lb

## 2023-11-13 DIAGNOSIS — H401131 Primary open-angle glaucoma, bilateral, mild stage: Secondary | ICD-10-CM | POA: Diagnosis not present

## 2023-11-13 DIAGNOSIS — Z01812 Encounter for preprocedural laboratory examination: Secondary | ICD-10-CM | POA: Insufficient documentation

## 2023-11-13 DIAGNOSIS — Z01818 Encounter for other preprocedural examination: Secondary | ICD-10-CM

## 2023-11-13 LAB — CBC
HCT: 46.2 % — ABNORMAL HIGH (ref 36.0–46.0)
Hemoglobin: 14.4 g/dL (ref 12.0–15.0)
MCH: 29.9 pg (ref 26.0–34.0)
MCHC: 31.2 g/dL (ref 30.0–36.0)
MCV: 95.9 fL (ref 80.0–100.0)
Platelets: 236 10*3/uL (ref 150–400)
RBC: 4.82 MIL/uL (ref 3.87–5.11)
RDW: 13.1 % (ref 11.5–15.5)
WBC: 7.2 10*3/uL (ref 4.0–10.5)
nRBC: 0 % (ref 0.0–0.2)

## 2023-11-13 NOTE — Progress Notes (Signed)
 PCP - Dr. Minus Amel Cardiologist - Denies  PPM/ICD - Denies Device Orders - n/a Rep Notified - n/a  Chest x-ray - n/a EKG - n/a Stress Test - per patient >20 yr ago; was clean.  Did as precautionary measure to make sure she had no heart problems. ECHO - Denies Cardiac Cath - Denies  Sleep Study - Denies CPAP - n/a  Fasting Blood Sugar - Denies  Checks Blood Sugar _____ times a day: n/a  Last dose of GLP1 agonist-  Denies GLP1 instructions: n/a  Blood Thinner Instructions: Denies Aspirin Instructions: Denies  ERAS Protcol - clears until 0515 PRE-SURGERY Ensure or G2- none  COVID TEST- n/a   Anesthesia review: No  Patient denies shortness of breath, fever, cough and chest pain at PAT appointment. Patient denies any respiratory issues at this time.    All instructions explained to the patient, with a verbal understanding of the material. Patient agrees to go over the instructions while at home for a better understanding. Patient also instructed to self quarantine after being tested for COVID-19. The opportunity to ask questions was provided.

## 2023-11-21 MED ORDER — SODIUM CHLORIDE 0.9 % IV SOLN
3.0000 g | INTRAVENOUS | Status: AC
  Administered 2023-11-22: 3 g via INTRAVENOUS
  Filled 2023-11-21: qty 8

## 2023-11-21 NOTE — Anesthesia Preprocedure Evaluation (Signed)
 Anesthesia Evaluation  Patient identified by MRN, date of birth, ID band Patient awake    Reviewed: Allergy & Precautions, NPO status , Patient's Chart, lab work & pertinent test results  History of Anesthesia Complications Negative for: history of anesthetic complications  Airway Mallampati: II  TM Distance: >3 FB Neck ROM: Full    Dental  (+) Dental Advisory Given   Pulmonary neg shortness of breath, sleep apnea (does not use CPAP) , neg COPD, neg recent URI, former smoker Sarcoidosis    Pulmonary exam normal breath sounds clear to auscultation       Cardiovascular (-) hypertension(-) angina (-) Past MI, (-) Cardiac Stents and (-) CABG (-) dysrhythmias  Rhythm:Regular Rate:Normal  HLD   Neuro/Psych negative neurological ROS     GI/Hepatic Neg liver ROS,GERD  Medicated,,  Endo/Other  neg diabetesHypothyroidism    Renal/GU negative Renal ROS     Musculoskeletal   Abdominal  (+) + obese  Peds  Hematology negative hematology ROS (+) Lab Results      Component                Value               Date                      WBC                      7.2                 11/13/2023                HGB                      14.4                11/13/2023                HCT                      46.2 (H)            11/13/2023                MCV                      95.9                11/13/2023                PLT                      236                 11/13/2023              Anesthesia Other Findings Left parotid gland adenoma  Reproductive/Obstetrics                             Anesthesia Physical Anesthesia Plan  ASA: 3  Anesthesia Plan: General   Post-op Pain Management: Tylenol PO (pre-op)*   Induction: Intravenous  PONV Risk Score and Plan: 3 and Ondansetron , Dexamethasone and Treatment may vary due to age or medical condition  Airway Management Planned: Oral ETT and Video  Laryngoscope Planned  Additional Equipment:   Intra-op Plan:   Post-operative Plan:  Extubation in OR  Informed Consent: I have reviewed the patients History and Physical, chart, labs and discussed the procedure including the risks, benefits and alternatives for the proposed anesthesia with the patient or authorized representative who has indicated his/her understanding and acceptance.     Dental advisory given  Plan Discussed with: CRNA and Anesthesiologist  Anesthesia Plan Comments: (NIM tube, remifentanil infusion  Risks of general anesthesia discussed including, but not limited to, sore throat, hoarse voice, chipped/damaged teeth, injury to vocal cords, nausea and vomiting, allergic reactions, lung infection, heart attack, stroke, and death. All questions answered. )       Anesthesia Quick Evaluation

## 2023-11-22 ENCOUNTER — Encounter (HOSPITAL_COMMUNITY)
Admission: RE | Disposition: A | Discharge status: Home / Self Care | Payer: Self-pay | Source: Home / Self Care | Attending: Otolaryngology

## 2023-11-22 ENCOUNTER — Other Ambulatory Visit: Payer: Self-pay

## 2023-11-22 ENCOUNTER — Observation Stay (HOSPITAL_COMMUNITY)
Admission: RE | Admit: 2023-11-22 | Discharge: 2023-11-23 | Disposition: A | Discharge status: Home / Self Care | Attending: Otolaryngology | Admitting: Otolaryngology

## 2023-11-22 ENCOUNTER — Ambulatory Visit (HOSPITAL_COMMUNITY): Payer: Self-pay | Admitting: Anesthesiology

## 2023-11-22 ENCOUNTER — Ambulatory Visit (HOSPITAL_BASED_OUTPATIENT_CLINIC_OR_DEPARTMENT_OTHER): Payer: Self-pay | Admitting: Anesthesiology

## 2023-11-22 ENCOUNTER — Encounter (HOSPITAL_COMMUNITY): Payer: Self-pay

## 2023-11-22 DIAGNOSIS — K219 Gastro-esophageal reflux disease without esophagitis: Secondary | ICD-10-CM | POA: Diagnosis not present

## 2023-11-22 DIAGNOSIS — E039 Hypothyroidism, unspecified: Secondary | ICD-10-CM

## 2023-11-22 DIAGNOSIS — K118 Other diseases of salivary glands: Principal | ICD-10-CM | POA: Diagnosis present

## 2023-11-22 DIAGNOSIS — C07 Malignant neoplasm of parotid gland: Secondary | ICD-10-CM | POA: Diagnosis not present

## 2023-11-22 DIAGNOSIS — G4733 Obstructive sleep apnea (adult) (pediatric): Secondary | ICD-10-CM

## 2023-11-22 DIAGNOSIS — D110 Benign neoplasm of parotid gland: Secondary | ICD-10-CM | POA: Diagnosis not present

## 2023-11-22 DIAGNOSIS — Z87891 Personal history of nicotine dependence: Secondary | ICD-10-CM | POA: Diagnosis not present

## 2023-11-22 DIAGNOSIS — D490 Neoplasm of unspecified behavior of digestive system: Secondary | ICD-10-CM | POA: Diagnosis not present

## 2023-11-22 HISTORY — PX: PAROTIDECTOMY: SHX2163

## 2023-11-22 LAB — CREATININE, SERUM
Creatinine, Ser: 0.98 mg/dL (ref 0.44–1.00)
GFR, Estimated: >60 mL/min (ref >60)

## 2023-11-22 LAB — CBC
HCT: 44.1 % (ref 36.0–46.0)
Hemoglobin: 14.1 g/dL (ref 12.0–15.0)
MCH: 29.7 pg (ref 26.0–34.0)
MCHC: 32 g/dL (ref 30.0–36.0)
MCV: 92.8 fL (ref 80.0–100.0)
Platelets: 222 10*3/uL (ref 150–400)
RBC: 4.75 MIL/uL (ref 3.87–5.11)
RDW: 13.2 % (ref 11.5–15.5)
WBC: 10.9 10*3/uL — ABNORMAL HIGH (ref 4.0–10.5)
nRBC: 0 % (ref 0.0–0.2)

## 2023-11-22 SURGERY — EXCISION, PAROTID GLAND
Anesthesia: General | Site: Face | Laterality: Left

## 2023-11-22 MED ORDER — FENTANYL CITRATE (PF) 250 MCG/5ML IJ SOLN
INTRAMUSCULAR | Status: AC
  Filled 2023-11-22: qty 5

## 2023-11-22 MED ORDER — DEXAMETHASONE SODIUM PHOSPHATE 10 MG/ML IJ SOLN
INTRAMUSCULAR | Status: DC | PRN
  Administered 2023-11-22: 10 mg via INTRAVENOUS

## 2023-11-22 MED ORDER — ENOXAPARIN SODIUM 40 MG/0.4ML IJ SOSY: 40.0000 mg | PREFILLED_SYRINGE | INTRAMUSCULAR | Status: DC

## 2023-11-22 MED ORDER — SUCCINYLCHOLINE CHLORIDE 200 MG/10ML IV SOSY
PREFILLED_SYRINGE | INTRAVENOUS | Status: DC | PRN
  Administered 2023-11-22: 100 mg via INTRAVENOUS

## 2023-11-22 MED ORDER — 0.9 % SODIUM CHLORIDE (POUR BTL) OPTIME
TOPICAL | Status: DC | PRN
  Administered 2023-11-22: 1000 mL

## 2023-11-22 MED ORDER — SENNA 8.6 MG PO TABS
1.0000 | ORAL_TABLET | Freq: Two times a day (BID) | ORAL | Status: DC
  Administered 2023-11-22 (×2): 8.6 mg via ORAL
  Filled 2023-11-22 (×2): qty 1

## 2023-11-22 MED ORDER — PHENYLEPHRINE HCL-NACL 20-0.9 MG/250ML-% IV SOLN
INTRAVENOUS | Status: DC | PRN
  Administered 2023-11-22: 50 ug/min via INTRAVENOUS

## 2023-11-22 MED ORDER — ROCURONIUM BROMIDE 10 MG/ML (PF) SYRINGE
PREFILLED_SYRINGE | INTRAVENOUS | Status: AC
  Filled 2023-11-22: qty 10

## 2023-11-22 MED ORDER — SODIUM CHLORIDE 0.9 % IV SOLN
0.1500 ug/kg/min | Freq: Once | INTRAVENOUS | Status: AC
  Administered 2023-11-22: .05 ug/kg/min via INTRAVENOUS
  Filled 2023-11-22: qty 2000

## 2023-11-22 MED ORDER — OXYCODONE HCL 5 MG/5ML PO SOLN: 5.0000 mg | Freq: Once | ORAL | Status: DC | PRN

## 2023-11-22 MED ORDER — ACETAMINOPHEN 500 MG PO TABS
1000.0000 mg | ORAL_TABLET | Freq: Once | ORAL | Status: AC
  Administered 2023-11-22: 1000 mg via ORAL
  Filled 2023-11-22: qty 2

## 2023-11-22 MED ORDER — MIDAZOLAM HCL 2 MG/2ML IJ SOLN
INTRAMUSCULAR | Status: DC | PRN
  Administered 2023-11-22: 2 mg via INTRAVENOUS

## 2023-11-22 MED ORDER — LIDOCAINE 2% (20 MG/ML) 5 ML SYRINGE
INTRAMUSCULAR | Status: AC
  Filled 2023-11-22: qty 5

## 2023-11-22 MED ORDER — ACETAMINOPHEN 160 MG/5ML PO SOLN
650.0000 mg | ORAL | Status: DC | PRN
  Administered 2023-11-22: 650 mg via ORAL
  Filled 2023-11-22: qty 20.3

## 2023-11-22 MED ORDER — PROPOFOL 10 MG/ML IV BOLUS
INTRAVENOUS | Status: AC
Start: 2023-11-22 — End: 2023-11-22
  Filled 2023-11-22: qty 20

## 2023-11-22 MED ORDER — ACETAMINOPHEN 650 MG RE SUPP: 650.0000 mg | RECTAL | Status: DC | PRN

## 2023-11-22 MED ORDER — MIDAZOLAM HCL 2 MG/2ML IJ SOLN
INTRAMUSCULAR | Status: AC
  Filled 2023-11-22: qty 2

## 2023-11-22 MED ORDER — PROPOFOL 10 MG/ML IV BOLUS
INTRAVENOUS | Status: AC
  Filled 2023-11-22: qty 20

## 2023-11-22 MED ORDER — OXYCODONE HCL 5 MG PO TABS: 5.0000 mg | ORAL_TABLET | Freq: Once | ORAL | Status: DC | PRN

## 2023-11-22 MED ORDER — CHLORHEXIDINE GLUCONATE 0.12 % MT SOLN
15.0000 mL | Freq: Once | OROMUCOSAL | Status: AC
  Administered 2023-11-22: 15 mL via OROMUCOSAL
  Filled 2023-11-22: qty 15

## 2023-11-22 MED ORDER — BACITRACIN ZINC 500 UNIT/GM EX OINT
TOPICAL_OINTMENT | CUTANEOUS | Status: AC
  Filled 2023-11-22: qty 28.35

## 2023-11-22 MED ORDER — ONDANSETRON HCL 4 MG PO TABS: 4.0000 mg | ORAL_TABLET | ORAL | Status: DC | PRN

## 2023-11-22 MED ORDER — LIDOCAINE-EPINEPHRINE 1 %-1:100000 IJ SOLN
INTRAMUSCULAR | Status: DC | PRN
  Administered 2023-11-22: 5 mL

## 2023-11-22 MED ORDER — FENTANYL CITRATE (PF) 100 MCG/2ML IJ SOLN: 25.0000 ug | INTRAMUSCULAR | Status: DC | PRN

## 2023-11-22 MED ORDER — AMISULPRIDE (ANTIEMETIC) 5 MG/2ML IV SOLN: 10.0000 mg | Freq: Once | INTRAVENOUS | Status: DC | PRN

## 2023-11-22 MED ORDER — LIDOCAINE 2% (20 MG/ML) 5 ML SYRINGE
INTRAMUSCULAR | Status: DC | PRN
  Administered 2023-11-22: 100 mg via INTRAVENOUS

## 2023-11-22 MED ORDER — PROPOFOL 10 MG/ML IV BOLUS
INTRAVENOUS | Status: DC | PRN
  Administered 2023-11-22: 150 mg via INTRAVENOUS
  Administered 2023-11-22: 50 mg via INTRAVENOUS

## 2023-11-22 MED ORDER — FENTANYL CITRATE (PF) 250 MCG/5ML IJ SOLN
INTRAMUSCULAR | Status: DC | PRN
  Administered 2023-11-22: 50 ug via INTRAVENOUS
  Administered 2023-11-22: 100 ug via INTRAVENOUS

## 2023-11-22 MED ORDER — ONDANSETRON HCL 4 MG/2ML IJ SOLN
INTRAMUSCULAR | Status: AC
  Filled 2023-11-22: qty 2

## 2023-11-22 MED ORDER — PHENYLEPHRINE 80 MCG/ML (10ML) SYRINGE FOR IV PUSH (FOR BLOOD PRESSURE SUPPORT)
PREFILLED_SYRINGE | INTRAVENOUS | Status: DC | PRN
  Administered 2023-11-22 (×4): 80 ug via INTRAVENOUS

## 2023-11-22 MED ORDER — ONDANSETRON HCL 4 MG/2ML IJ SOLN
INTRAMUSCULAR | Status: DC | PRN
  Administered 2023-11-22: 4 mg via INTRAVENOUS

## 2023-11-22 MED ORDER — ONDANSETRON HCL 4 MG/2ML IJ SOLN
4.0000 mg | INTRAMUSCULAR | Status: DC | PRN
Start: 2023-11-22 — End: 2023-11-23

## 2023-11-22 MED ORDER — CEFAZOLIN SODIUM-DEXTROSE 1-4 GM/50ML-% IV SOLN
1.0000 g | Freq: Three times a day (TID) | INTRAVENOUS | Status: AC
Start: 2023-11-22 — End: 2023-11-23
  Administered 2023-11-22 – 2023-11-23 (×3): 1 g via INTRAVENOUS
  Filled 2023-11-22 (×3): qty 50

## 2023-11-22 MED ORDER — ORAL CARE MOUTH RINSE: 15.0000 mL | Freq: Once | OROMUCOSAL | Status: AC

## 2023-11-22 MED ORDER — OXYCODONE HCL 5 MG PO TABS: 5.0000 mg | ORAL_TABLET | Freq: Four times a day (QID) | ORAL | Status: DC | PRN

## 2023-11-22 MED ORDER — LACTATED RINGERS IV SOLN: INTRAVENOUS | Status: DC

## 2023-11-22 MED ORDER — DEXAMETHASONE SODIUM PHOSPHATE 10 MG/ML IJ SOLN
8.0000 mg | Freq: Three times a day (TID) | INTRAMUSCULAR | Status: DC
  Administered 2023-11-22 – 2023-11-23 (×3): 8 mg via INTRAVENOUS
  Filled 2023-11-22 (×4): qty 0.8

## 2023-11-22 MED ORDER — DEXAMETHASONE SODIUM PHOSPHATE 10 MG/ML IJ SOLN
INTRAMUSCULAR | Status: AC
  Filled 2023-11-22: qty 1

## 2023-11-22 MED ORDER — LIDOCAINE-EPINEPHRINE 1 %-1:100000 IJ SOLN
INTRAMUSCULAR | Status: AC
  Filled 2023-11-22: qty 1

## 2023-11-22 MED ORDER — BACITRACIN ZINC 500 UNIT/GM EX OINT
1.0000 | TOPICAL_OINTMENT | Freq: Three times a day (TID) | CUTANEOUS | Status: DC
  Administered 2023-11-22 – 2023-11-23 (×3): 1 via TOPICAL
  Filled 2023-11-22: qty 28.35

## 2023-11-22 SURGICAL SUPPLY — 45 items
BAG COUNTER SPONGE SURGICOUNT (BAG) ×1 IMPLANT
BENZOIN TINCTURE PRP APPL 2/3 (GAUZE/BANDAGES/DRESSINGS) ×1 IMPLANT
BLADE SURG 15 STRL LF DISP TIS (BLADE) ×1 IMPLANT
CANISTER SUCTION 3000ML PPV (SUCTIONS) ×1 IMPLANT
CNTNR URN SCR LID CUP LEK RST (MISCELLANEOUS) ×1 IMPLANT
CORD BIPOLAR FORCEPS 12FT (ELECTRODE) ×1 IMPLANT
COVER SURGICAL LIGHT HANDLE (MISCELLANEOUS) ×1 IMPLANT
DRAIN CHANNEL 10M FLAT 3/4 FLT (DRAIN) IMPLANT
DRAPE INCISE IOBAN 66X45 STRL (DRAPES) ×1 IMPLANT
DRAPE POUCH INSTRU U-SHP 10X18 (DRAPES) ×1 IMPLANT
DRAPE SURG 17X23 STRL (DRAPES) ×1 IMPLANT
DRSG TEGADERM 2-3/8X2-3/4 SM (GAUZE/BANDAGES/DRESSINGS) ×2 IMPLANT
DRSG TEGADERM 4X4.75 (GAUZE/BANDAGES/DRESSINGS) ×2 IMPLANT
ELECT COATED BLADE 2.86 ST (ELECTRODE) ×1 IMPLANT
ELECTRODE 4 CHANNEL SET (MISCELLANEOUS) IMPLANT
ELECTRODE PAIRED SUBDERMAL (MISCELLANEOUS) ×1 IMPLANT
ELECTRODE REM PT RTRN 9FT ADLT (ELECTROSURGICAL) ×1 IMPLANT
EVACUATOR SILICONE 100CC (DRAIN) IMPLANT
FORCEPS BIPOLAR SPETZLER 8 1.0 (NEUROSURGERY SUPPLIES) ×1 IMPLANT
GAUZE 4X4 16PLY ~~LOC~~+RFID DBL (SPONGE) ×1 IMPLANT
GAUZE SPONGE 4X4 12PLY STRL (GAUZE/BANDAGES/DRESSINGS) ×1 IMPLANT
GLOVE BIO SURGEON STRL SZ 6.5 (GLOVE) ×1 IMPLANT
GLOVE BIO SURGEON STRL SZ7.5 (GLOVE) ×1 IMPLANT
GOWN STRL REUS W/ TWL LRG LVL3 (GOWN DISPOSABLE) ×2 IMPLANT
HEMOSTAT SURGICEL 2X14 (HEMOSTASIS) ×1 IMPLANT
HOOK RETRACT STAY BLUNT 12 (MISCELLANEOUS) IMPLANT
KIT BASIN OR (CUSTOM PROCEDURE TRAY) ×1 IMPLANT
KIT TURNOVER KIT B (KITS) ×1 IMPLANT
NDL HYPO 25GX1X1/2 BEV (NEEDLE) ×1 IMPLANT
NEEDLE HYPO 25GX1X1/2 BEV (NEEDLE) ×1 IMPLANT
NS IRRIG 1000ML POUR BTL (IV SOLUTION) ×1 IMPLANT
PAD ARMBOARD POSITIONER FOAM (MISCELLANEOUS) ×2 IMPLANT
PENCIL SMOKE EVACUATOR (MISCELLANEOUS) ×1 IMPLANT
PROBE NERVBE PRASS .33 (MISCELLANEOUS) ×1 IMPLANT
SPONGE INTESTINAL PEANUT (DISPOSABLE) ×1 IMPLANT
STRIP CLOSURE SKIN 1/2X4 (GAUZE/BANDAGES/DRESSINGS) ×1 IMPLANT
SUT MNCRL AB 4-0 PS2 18 (SUTURE) ×2 IMPLANT
SUT PLAIN GUT FAST 5-0 (SUTURE) IMPLANT
SUT SILK 2 0 REEL (SUTURE) ×1 IMPLANT
SUT SILK 2 0 SH (SUTURE) IMPLANT
SUT SILK 2 0 SH CR/8 (SUTURE) ×1 IMPLANT
SUT SILK 3 0 REEL (SUTURE) ×1 IMPLANT
SUT VIC AB 4-0 PS2 27 (SUTURE) ×1 IMPLANT
SUT VIC AB 4-0 RB1 18 (SUTURE) IMPLANT
TRAY ENT MC OR (CUSTOM PROCEDURE TRAY) ×1 IMPLANT

## 2023-11-22 NOTE — Interval H&P Note (Signed)
 History and Physical Interval Note:  11/22/2023 7:17 AM  Brooke Herring  has presented today for surgery, with the diagnosis of Pleomorphic adenoma of parotid gland.  The various methods of treatment have been discussed with the patient and family. After consideration of risks, benefits and other options for treatment, the patient has consented to  Procedure(s): EXCISION, PAROTID GLAND (Left) as a surgical intervention.  The patient's history has been reviewed, patient examined, no change in status, stable for surgery.  I have reviewed the patient's chart and labs.  Questions were answered to the patient's satisfaction.     Kinley Ferrentino

## 2023-11-22 NOTE — Anesthesia Procedure Notes (Signed)
 Procedure Name: Intubation Date/Time: 11/22/2023 10:37 AM  Performed by: Wynonia Alfonzo LABOR, CRNAPre-anesthesia Checklist: Patient identified, Emergency Drugs available, Suction available and Patient being monitored Patient Re-evaluated:Patient Re-evaluated prior to induction Oxygen Delivery Method: Circle System Utilized Preoxygenation: Pre-oxygenation with 100% oxygen Induction Type: IV induction Ventilation: Mask ventilation without difficulty Laryngoscope Size: Glidescope and 3 Grade View: Grade I Tube type: Oral Tube size: 7.0 mm Number of attempts: 1 Airway Equipment and Method: Stylet and Oral airway Placement Confirmation: ETT inserted through vocal cords under direct vision, positive ETCO2 and breath sounds checked- equal and bilateral Secured at: 22 cm Tube secured with: Tape Dental Injury: Teeth and Oropharynx as per pre-operative assessment

## 2023-11-22 NOTE — Transfer of Care (Signed)
 Immediate Anesthesia Transfer of Care Note  Patient: Brooke Herring  Procedure(s) Performed: EXCISION, PAROTID GLAND (Left: Face)  Patient Location: PACU  Anesthesia Type:General  Level of Consciousness: awake, alert , and oriented  Airway & Oxygen Therapy: Patient Spontanous Breathing and Patient connected to nasal cannula oxygen  Post-op Assessment: Report given to RN and Post -op Vital signs reviewed and stable  Post vital signs: Reviewed and stable  Last Vitals:  Vitals Value Taken Time  BP 116/67 11/22/23 11:00  Temp    Pulse 78 11/22/23 11:07  Resp 10 11/22/23 11:07  SpO2 93 % 11/22/23 11:07  Vitals shown include unfiled device data.  Last Pain:  Vitals:   11/22/23 0638  TempSrc:   PainSc: 0-No pain      Patients Stated Pain Goal: 0 (11/22/23 9366)  Complications: No notable events documented.

## 2023-11-22 NOTE — Progress Notes (Signed)
 Patient arrived from PACU with a 1/10 headache, denies need for pain medication, patient is A&O x 4, walks with minimal assist. Patient is currently sitting up in chair, JP drain with 0ml noted. All needs met at this time, bed in lowest position and call light within reach.

## 2023-11-22 NOTE — Anesthesia Postprocedure Evaluation (Signed)
 Anesthesia Post Note  Patient: Brooke Herring  Procedure(s) Performed: EXCISION, PAROTID GLAND (Left: Face)     Patient location during evaluation: PACU Anesthesia Type: General Level of consciousness: awake Pain management: pain level controlled Vital Signs Assessment: post-procedure vital signs reviewed and stable Respiratory status: spontaneous breathing, nonlabored ventilation and respiratory function stable Cardiovascular status: blood pressure returned to baseline and stable Postop Assessment: no apparent nausea or vomiting Anesthetic complications: no   No notable events documented.  Last Vitals:  Vitals:   11/22/23 1200 11/22/23 1226  BP: 132/65 (!) 140/66  Pulse: 76 72  Resp: 17 18  Temp: 36.7 C 36.8 C  SpO2: 95% 93%    Last Pain:  Vitals:   11/22/23 1145  TempSrc:   PainSc: 0-No pain                 Delon Aisha Arch

## 2023-11-22 NOTE — Plan of Care (Signed)
  Problem: Nutrition: Goal: Adequate nutrition will be maintained Outcome: Progressing   Problem: Coping: Goal: Level of anxiety will decrease Outcome: Progressing   Problem: Pain Managment: Goal: General experience of comfort will improve and/or be controlled Outcome: Progressing   Problem: Skin Integrity: Goal: Risk for impaired skin integrity will decrease Outcome: Progressing   Problem: Clinical Measurements: Goal: Complications related to the disease process, condition or treatment will be avoided or minimized Outcome: Progressing

## 2023-11-22 NOTE — Op Note (Signed)
 Operative Report  Preoperative diagnosis: Left parotid neoplasm  Postop diagnosis: same  Procedure: Left superficial parotidectomy with dissection of facial nerve  Surgeon: Elena Larry, MD  Assist: None  Anesth: General and local with 1% lidocaine  with 1:100,000 epinephrine  Complications: None  Findings: 3 cm lower parotid mass in the tail of the parotid located inferior to the lower division of the facial nerve, requiring facial nerve dissection due to the nerve overlying the mass (instead of running underneath the mass  Specimen:  Left parotid mass removed en-block    Indication: 59 ear-old female with a history of a left parotid mass, and preoperative biopsy results consistent with pleomorphic adenoma. She was advised to undergo superficial parotidectomy with facial nerve dissection. Risks and benefits of the procedure were discussed with the patient and family, and they elected to proceed.   Description:  The patient was brought to the operating room and placed on the operative table in the supine position.  Anesthesia was induced and the patient was intubated by the anesthesia team without difficulty.  The patient was given intravenous antibiotics.  The left parotid incision was marked with a marking pen and injected with local anesthetic.  The nerve integrity monitor was placed along the face for facial nerve monitoring and turned on during the case.  The right face and neck were prepped and draped in sterile fashion.  A modified Blair incision was made with a 15 blade scalpel and extended through the subcutaneous and platysma layers with Bovie electrocautery.  A pre-parotid flap was elevated anteriorly and the earlobe was freed.  Skin flaps were sutured back with stay sutures.  Dissection was then performed anterior to the tragus and parotid tissue was elevated off of the mastoid.  With further dissection to the stylomastoid foramen, the main trunk of the facial nerve was  identified.  The nerve was dissected to the main division and then down the inferior branches, dividing parotid tissue using the harmonic scalpel and bipolar electrocautery. Once the majority of branches were identified it became clear that the inferior division branches were overlying the mass instead of running underneath it requiring a careful dissection of the nerve branches before the mass had to be removed.    The marginal mandibular nerve was traced in an antegrade fashion until the inferior gland was able to be freed and the nerve kept intact. The remainder of the inferior division branches were also gently dissected and reflected away from the mass.  The nerve stimulator was used for assistance and all distal branches were stimulating without issues during the case..  The inferior parotid gland with mass was dissected from surrounding tissues and the nerve branches and removed.  This was passed to nursing for pathology.  The surgical site was then irrigated copiously with saline.  A 7 JP suction drain was placed in the depth of the wound and secured at the skin with 2-0 Nylon with a standard drain stitch.  The flaps were released and laid back down. The deep layers were closed with 3-0 Vicryls. The skin was closed with 5-0 plain gut suture in running locking fashion  The drain was placed to bulb suction.  Bacitracin ointment was added to the incision.  Drapes were removed and the drain was secured to the shoulder with tape.  She was then returned to anesthesia for wake-up and was extubated and moved to the recovery room in stable condition.

## 2023-11-22 NOTE — Anesthesia Procedure Notes (Signed)
 Procedure Name: Intubation Date/Time: 11/22/2023 7:37 AM  Performed by: Ravin Bendall A, CRNAPre-anesthesia Checklist: Patient identified, Emergency Drugs available, Suction available and Patient being monitored Patient Re-evaluated:Patient Re-evaluated prior to induction Oxygen Delivery Method: Circle System Utilized Preoxygenation: Pre-oxygenation with 100% oxygen Induction Type: IV induction Ventilation: Mask ventilation without difficulty Laryngoscope Size: Mac and 4 Grade View: Grade II Tube type: Oral Tube size: 7.5 mm Number of attempts: 1 Airway Equipment and Method: Stylet and Oral airway Placement Confirmation: ETT inserted through vocal cords under direct vision, positive ETCO2 and breath sounds checked- equal and bilateral Secured at: 22 cm Tube secured with: Tape Dental Injury: Teeth and Oropharynx as per pre-operative assessment

## 2023-11-23 ENCOUNTER — Encounter (HOSPITAL_COMMUNITY): Payer: Self-pay | Admitting: Otolaryngology

## 2023-11-23 DIAGNOSIS — Z87891 Personal history of nicotine dependence: Secondary | ICD-10-CM | POA: Diagnosis not present

## 2023-11-23 DIAGNOSIS — E039 Hypothyroidism, unspecified: Secondary | ICD-10-CM | POA: Diagnosis not present

## 2023-11-23 DIAGNOSIS — C07 Malignant neoplasm of parotid gland: Secondary | ICD-10-CM | POA: Diagnosis not present

## 2023-11-23 DIAGNOSIS — K219 Gastro-esophageal reflux disease without esophagitis: Secondary | ICD-10-CM | POA: Diagnosis not present

## 2023-11-23 MED ORDER — OXYCODONE HCL 5 MG PO TABS: 5.0000 mg | ORAL_TABLET | Freq: Four times a day (QID) | ORAL | 0 refills | Status: AC | PRN

## 2023-11-23 NOTE — Discharge Instructions (Signed)
 Parotid Surgery Postoperative Care Instructions:  The Surgery Itself Parotid surgery involves general anesthesia, typically for 2-3 hours. Patients may  be quite sedated for several hours after surgery and may remain sleepy for much  of the day. Nausea and vomiting are occasionally seen, and usually resolve by  the evening of surgery - even without additional medications. Some patients stay overnight in the hospital; other patients can go home the evening of surgery.  Most patients will have a drain in place after surgery, which is usually removed in the office 2-4 days after surgery.   Your Incision Your incision is closed with sutures, there may be tape or skin glue over your  incision. Do not remove this tape or the glue. You can shower and wash your hair  as usual starting 24 hours after your drain is removed. You may wash in a  bathtub prior to that time if you are careful not to get your neck wet. Use a dab of  Bacitracin ointment on your drain site (under and behind your ear) before and  after showering. It is normal to have some red or pink drainage from your drain  exit site for 1-2 days after the drain is removed. Do not soak or scrub the incision.  You might notice swelling and bruising around your incision, upper neck and face  after surgery. In addition, the scar may become pink and hard. This hardening  will peak at about 6 weeks and may result in some tightness, which will  disappear over the next 2 to 3 months. You will have numbness of the skin  around the incision and the lower ear on the side of surgery. This will resolve  slowly after surgery except on the earlobe, where some patients have permanent  numbness. Be very careful when shaving if your neck skin is numb. You should  apply sunscreen on your incision site starting 1 month after surgery EVERY day  for the first year after surgery. This will prevent a red or pink scar and give you  the best cosmetic result for  your scar. A daily moisturizer with sunscreen  (example Oil of Olay with SPF 30) is fine.   Limitations You can start resuming normal activities as tolerated 7 days after surgery. For  some patients, lifting can cause pain and stretching at the surgery site for up to 3  weeks after surgery. You should not drive or drink alcohol while taking pain  medications. Most people can return to work/school 1-2 weeks after surgery,  but there may be physical limitations as far as what you may do while at work. Your  surgeon will review your specific limitations and release you when you are ready  to return to work.   Medications ? Pain medication can be used for pain as prescribed. Pain is expected after  surgery. Your neck and face will be sore and pain will be worse when the  neck is stretched and moved. As the surgical site heals, pain will resolve  over the course of a week. It is not uncommon for pain to get worse when  you first go home because your activity may increase but from that point  on the pain should improve every day. Pain medications can cause  nausea, which can be prevented if you take them with food or milk. ? You may be given a stool softener (Colace) because pain medications may  make you constipated. It is recommended that you use these  starting right  after surgery -- you may discontinue if you find that you are having normal  or loose stools. ? Bacitracin ointment should be applied to the drain exit site three times a  day for 2 days after the drain is removed. It should also be applied before  and after showering for 24 hours after the drain is removed. Bacitracin  ointment can also be applied to the incision once the tape is removed or  the glue peels off. This prevents scabbing and itching. ? Take all of your routine medications as prescribed, unless told otherwise  by your surgeon. Any medications that thin the blood should be avoided.  ? IT IS OK TO TAKE OVER THE  COUNTER PAIN MEDICATION  (IBUPROFEN, NAPROXEN, or ACETAMINOPHEN) IN ADDITION TO  YOUR PRESCRIBED MEDICATIONS. DO NOT TAKE ASPIRIN UNLESS  CLEARED WITH YOUR SURGEON.  ? Limit Acetaminophen/Tylenol to less than 4,000mg /day  ? Limit Ibuprofen/Motrin to less than 3,600mg /day  Pain ? The main complaint following parotid surgery is pain with eating,  swallowing and neck movement. Some people experience a dull ache,  while others feel a sharp pain. This should not keep you from eating  anything you want and will improve daily after surgery. When the sensation  returns to the skin of the neck and ear, there may be feelings like electric  shocks from the sensory nerves returning to normal function. The nerve  that controls movement of the facial muscles is exposed during parotid  surgery. Because of this, some patients have weakness in those muscles  after surgery from swelling which will resolve with time. This may make the face feel "heavy" or "sluggish."  Cough If your operation was done under general anesthesia, you may feel like you have  phlegm in your throat or a sore throat. This is usually because there was a tube  in your windpipe while you were asleep that caused irritation that you perceive as  phlegm. You will notice that if you cough, very little phlegm will come up. This  should clear up in 4 to 5 days.  Reasons to call your surgeon's office ? Persistent fever over 101 F ? Bleeding from the neck incision ? Increasing facial or neck swelling ? Sudden loss of facial movement ? Pain that is not relieved by your medications ? Purulent drainage (pus) from the incision ? Significant redness surrounding the incision that is worsening or getting bigger  How to Contact Your Surgeon Please call [phone] (Dr Leighton Roach office) with any questions or concerns after the surgery. You will be seen on Monday 03/20/23 at 9 am in our office at [address] [facility] for post-op check and  drain removal.   You were prescribed the following medications:  Take Oxycodone - narcotic pain medication - only if you have severe pain. If you take it, you might require over the counter stool softeners to prevent constipation. You cannot drive or operate machinery when taking narcotic pain medications.  Take antibiotic (Augmentin) to prevent infection Take steroid taper (Medrol dose pack) to help with swelling Use artificial tears to help with eye itching

## 2023-11-23 NOTE — Discharge Summary (Signed)
 Physician Discharge Summary  Patient ID: Brooke Herring MRN: [id number] DOB/AGE: 70-Feb-1957 70 y.o.  Admit date: 11/22/2023 Discharge date: 11/23/2023  Admission Diagnoses:  Discharge Diagnoses:  Principal Problem:   Parotid mass Active Problems:   Pleomorphic adenoma of parotid gland   Discharged Condition: good  Hospital Course:  33 yoF with a history of left parotid mass, who underwent left parotidectomy and facial nerve dissection who was admitted for observation overnight. She had no issues with facial movements, tolerated regular diet, was ambulating without difficulties. Drain output was minimal and her drain was removed on POD#1. She was discharged home on POD#1  Discharge Exam: Blood pressure 135/70, pulse 75, temperature 98.3 F (36.8 C), temperature source Oral, resp. rate 18, height 5' 2 (1.575 m), weight 87.6 kg, SpO2 92%. Exam: General: Well-developed, well-nourished Respiratory Respiratory effort: Equal inspiration and expiration without stridor Cardiovascular Peripheral Vascular: Warm extremities with equal color/perfusion Eyes: No nystagmus with equal extraocular motion bilaterally Neuro/Psych/Balance: Patient oriented to person, place, and time; Appropriate mood and affect; Gait is intact with no imbalance; Cranial nerves I-XII are intact Head and Face Inspection: Normocephalic and atraumatic without mass or lesion Palpation: Facial skeleton intact without bony stepoffs Salivary Glands: No mass or tenderness left parotid incision c/d/I JP removed Facial Strength: Facial motility symmetric and full bilaterally ENT Pinna: External ear intact and fully developed External canal: Canal is patent with intact skin Tympanic Membrane: Clear and mobile External Nose: No scar or anatomic deformity Internal Nose: Septum is relatively straight on anterior rhinoscopy. Bilateral inferior turbinate hypertrophy.  Lips, Teeth, and gums: Mucosa and teeth intact and  viable Oral cavity/oropharynx: No erythema or exudate, no lesions present Neck Neck and Trachea: Midline trachea without mass or lesion Thyroid : No mass or nodularity Lymphatics: No lymphadenopathy   Disposition:   Discharge Instructions     Diet - low sodium heart healthy   Complete by: As directed    Increase activity slowly   Complete by: As directed    No dressing needed   Complete by: As directed       Allergies as of 11/23/2023       Reactions   Zithromax [azithromycin] Other (See Comments)   Severe headaches.        Medication List     TAKE these medications    latanoprost 0.005 % ophthalmic solution Commonly known as: XALATAN Place 1 drop into both eyes at bedtime.   levothyroxine 100 MCG tablet Commonly known as: SYNTHROID Take 100 mcg by mouth daily at 6 (six) AM.   loratadine 10 MG tablet Commonly known as: CLARITIN Take 10 mg by mouth daily before lunch. (1100)   oxyCODONE 5 MG immediate release tablet Commonly known as: Oxy IR/ROXICODONE Take 1 tablet (5 mg total) by mouth every 6 (six) hours as needed for severe pain (pain score 7-10).   pantoprazole  40 MG tablet Commonly known as: PROTONIX  TAKE 1 TABLET BY MOUTH TWICE DAILY BEFORE A MEAL What changed:  when to take this reasons to take this   rosuvastatin 5 MG tablet Commonly known as: CRESTOR Take 5 mg by mouth every evening.               Discharge Care Instructions  (From admission, onward)           Start     Ordered   11/23/23 0000  No dressing needed        11/23/23 9287  Signed: Lateshia Schmoker 11/23/2023, 7:13 AM

## 2023-11-23 NOTE — Progress Notes (Signed)
 Reviewed AVS, patient expressed understanding of medications, MD follow up reviewed.   Patient states all belongings brought to the hospital at time of admission are accounted for and packed to take home.  Patient informed and expressed understanding of where to pick up discharge medication.  Pt transported to entrance A where family member was waiting in vehicle to transport home.

## 2023-11-23 NOTE — Progress Notes (Signed)
 ENT PROGRESS NOTE   Subjective: Patient seen and examined at bedside. No issues overnight. No significant pain, no facial weakness. Tolerating regular diet.   Objective: Vital signs in last 24 hours: Temp:  [97.9 F (36.6 C)-98.3 F (36.8 C)] 98.3 F (36.8 C) (06/26 0518) Pulse Rate:  [69-90] 75 (06/26 0518) Resp:  [13-19] 18 (06/26 0518) BP: (116-149)/(60-82) 135/70 (06/26 0518) SpO2:  [91 %-97 %] 92 % (06/26 0518)  Exam: General: Well-developed, well-nourished Respiratory Respiratory effort: Equal inspiration and expiration without stridor Cardiovascular Peripheral Vascular: Warm extremities with equal color/perfusion Eyes: No nystagmus with equal extraocular motion bilaterally Neuro/Psych/Balance: Patient oriented to person, place, and time; Appropriate mood and affect; Gait is intact with no imbalance; Cranial nerves I-XII are intact Head and Face Inspection: Normocephalic and atraumatic without mass or lesion Palpation: Facial skeleton intact without bony stepoffs Salivary Glands: No mass or tenderness Modified Blair incision on the left c/d/I JP output 25 cc/24 hrs removed doring rounds  Facial Strength: Facial motility symmetric and full bilaterally ENT Pinna: External ear intact and fully developed External canal: Canal is patent with intact skin Tympanic Membrane: Clear and mobile External Nose: No scar or anatomic deformity Internal Nose: Septum is relatively straight on anterior rhinoscopy. Bilateral inferior turbinate hypertrophy.  Lips, Teeth, and gums: Mucosa and teeth intact and viable Oral cavity/oropharynx: No erythema or exudate, no lesions present Neck Neck and Trachea: Midline trachea without mass or lesion Thyroid : No mass or nodularity Lymphatics: No lymphadenopathy  Recent Labs    11/22/23 1243  CREATININE 0.98    Assessment/Plan: Left parotid mass, s/p superficial parotidectomy with CN 7 dissection, doing well JP removed during rounds.    - home today and see me as scheduled in 2 weeks.    LOS: 0 days    Brooke Larry, MD 11/23/2023, 7:08 AM

## 2023-11-23 NOTE — Plan of Care (Signed)
  Problem: Clinical Measurements: Goal: Ability to maintain clinical measurements within normal limits will improve Outcome: Progressing   Problem: Nutrition: Goal: Adequate nutrition will be maintained Outcome: Progressing   Problem: Coping: Goal: Level of anxiety will decrease Outcome: Progressing   Problem: Pain Managment: Goal: General experience of comfort will improve and/or be controlled Outcome: Progressing

## 2023-11-24 LAB — SURGICAL PATHOLOGY

## 2023-11-27 ENCOUNTER — Ambulatory Visit (INDEPENDENT_AMBULATORY_CARE_PROVIDER_SITE_OTHER): Payer: Self-pay

## 2023-11-27 NOTE — Telephone Encounter (Signed)
 Let the patient know about the results, patient understood.

## 2023-12-03 ENCOUNTER — Other Ambulatory Visit: Payer: Self-pay | Admitting: Gastroenterology

## 2023-12-11 ENCOUNTER — Ambulatory Visit (INDEPENDENT_AMBULATORY_CARE_PROVIDER_SITE_OTHER): Admitting: Otolaryngology

## 2023-12-11 ENCOUNTER — Encounter (INDEPENDENT_AMBULATORY_CARE_PROVIDER_SITE_OTHER): Payer: Self-pay | Admitting: Otolaryngology

## 2023-12-11 VITALS — BP 146/81 | HR 71

## 2023-12-11 DIAGNOSIS — J3089 Other allergic rhinitis: Secondary | ICD-10-CM

## 2023-12-11 DIAGNOSIS — Z9889 Other specified postprocedural states: Secondary | ICD-10-CM

## 2023-12-11 DIAGNOSIS — R0981 Nasal congestion: Secondary | ICD-10-CM

## 2023-12-11 DIAGNOSIS — R0982 Postnasal drip: Secondary | ICD-10-CM

## 2023-12-11 DIAGNOSIS — D110 Benign neoplasm of parotid gland: Secondary | ICD-10-CM

## 2023-12-11 MED ORDER — FLUTICASONE PROPIONATE 50 MCG/ACT NA SUSP
2.0000 | Freq: Two times a day (BID) | NASAL | 6 refills | Status: AC
Start: 2023-12-11 — End: ?

## 2023-12-11 MED ORDER — LEVOCETIRIZINE DIHYDROCHLORIDE 5 MG PO TABS: 5.0000 mg | ORAL_TABLET | Freq: Every evening | ORAL | 3 refills | Status: AC

## 2023-12-11 NOTE — Progress Notes (Signed)
 ENT Progress Note:   Update 12/11/2023  Discussed the use of AI scribe software for clinical note transcription with the patient, who gave verbal consent to proceed.  History of Present Illness Brooke Herring is a 68 year old female who presents for f/u after left superficial parotidectomy with CN 7 dissection. Final pathology was (+) pleomorphic adenoma  She continues to have minimal swelling along the left parotid, but otherwise doing well. She has been applying cream to the area, especially before going outside, and has been massaging the area and working around her ear to aid in healing. Occasionally, the area itches, which she associates with nerve recovery.  She experiences sinus issues, particularly in the fall, which she attributes to allergies. She typically gets a sinus infection once a year during the fall. She dislikes antibiotics and prefers to manage her symptoms with allergy medications such as Claritin. She also uses saline rinses, which she finds helpful in clearing out her nasal passages. No scans have been done for her sinuses in the past but neck CT showed clear paranasal sinuses.   Records Reviewed:  Initial Evaluation  Reason for Consult: left neck mass    HPI: Discussed the use of AI scribe software for clinical note transcription with the patient, who gave verbal consent to proceed.  History of Present Illness   Brooke Herring is a 68 year old female hx of OSA (non-compliant with CPAP), GERD, on PPI, who presents with a persistent left neck mass near angle of the mandible x 6-7 yrs.  She has had a lump in her neck for approximately six to seven years. The lump was previously imaged and identified as a non-cancerous tumor. Surgery was initially suggested but deferred for six months. She wants the mass removed due to occasional discomfort when lying in certain positions. No numbness, tingling, or facial weakness is present. There is no history of biopsy, but she recalls  undergoing special scans in the past.  She has a history of sarcoidosis. Her sarcoidosis manifests as lumps in her arms and legs, which appear and disappear over time. She does not take any medication for sarcoidosis and reports no involvement of her lungs or other organs.  She underwent an upper endoscopy in 2023 to address difficulty swallowing, where food would sometimes get stuck. A colonoscopy revealed one polyp, but no further colonoscopy is needed for another three to four years.  She is a former smoker, having quit smoking 11 to 12 years ago. Denies dysphagia currently. She denies hx of head and neck or skin cancer.     Records Reviewed:  GI note from 08/04/21 Brooke Herring is a 68 y.o. female here for follow-up of constipation and GERD.  Historically, has done well with the pantoprazole  40 mg daily.  She typically takes the medication after she eats lunch.  Notes notices nocturnal breakthrough symptoms no dysphagia.  EGD 2013 elsewhere demonstrated normal esophagus.  Colonoscopy last year yielded a adenomatous polyp; due for surveillance 4 years from now.  She remains obese.  Has chronic constipation may go 4 days without a bowel movement takes variety of over-the-counter agents.  Fairly frequently she has to take an enema to have a bowel movement.  She is not on a regular regimen.   Previously diagnosed with sleep apnea and remains untreated because  -  she did not like the mask.  Impression/Plan: 68 year old lady with suboptimally controlled GERD and poorly controlled constipation.  Untreated sleep apnea.  Needs optimization  of pantoprazole  administration throughout the day.  I discussed the importance of taking this medication 30 minutes to an hour before she eats.  Its okay to take it before her midday meal but it needs to be administered ahead of the meal.   Constipation without any alarm features.  She ought to be on regular therapy.  No alarm symptoms.  Recent colonoscopy findings  reassuring.  Previously diagnosed sleep apnea-untreated.  Discussed how this disorder may be negatively impacting her health globally.    Past Medical History:  Diagnosis Date   GERD (gastroesophageal reflux disease)    Hypothyroidism    Obesity, unspecified    OSA (obstructive sleep apnea) 04/23/2013   no cpap   Other malaise and fatigue    Sarcoidosis    Unspecified hypothyroidism     Past Surgical History:  Procedure Laterality Date   ABDOMINAL HYSTERECTOMY     COLONOSCOPY  09/2011   Dr. Dianna: hyperplastic polyps, internal hemorrhoids   COLONOSCOPY WITH PROPOFOL  N/A 06/25/2020   adenoma. surveillance 5 years   ESOPHAGOGASTRODUODENOSCOPY  09/2011   Dr. Dianna: normal esophagus, normal stomach, mucosa abnormality of duodenum but path negative. Negative H.pylori   ESOPHAGOGASTRODUODENOSCOPY (EGD) WITH PROPOFOL  N/A 12/09/2021   Procedure: ESOPHAGOGASTRODUODENOSCOPY (EGD) WITH PROPOFOL ;  Surgeon: Shaaron Lamar HERO, MD;  Location: [facility];  Service: Endoscopy;  Laterality: N/A;  1:45pm, pt knows to arrive at 8:15 - told nothing to drink after midnight   MALONEY DILATION N/A 12/09/2021   Procedure: AGAPITO DILATION;  Surgeon: Shaaron Lamar HERO, MD;  Location: [facility];  Service: Endoscopy;  Laterality: N/A;   PAROTIDECTOMY Left 11/22/2023   Procedure: EXCISION, PAROTID GLAND;  Surgeon: Okey Burns, MD;  Location: [facility];  Service: ENT;  Laterality: Left;   POLYPECTOMY  06/25/2020   Procedure: POLYPECTOMY;  Surgeon: Shaaron Lamar HERO, MD;  Location: [facility];  Service: Endoscopy;;   TONSILLECTOMY      Family History  Problem Relation Age of Onset   Colon cancer Neg Hx     Social History:  reports that she quit smoking about 12 years ago. Her smoking use included cigarettes. She has never used smokeless tobacco. She reports that she does not currently use alcohol. She reports that she does not use drugs.  Allergies:  Allergies  Allergen Reactions   Zithromax  [Azithromycin] Other (See Comments)    Severe headaches.    Medications: I have reviewed the patient's current medications.  The PMH, PSH, Medications, Allergies, and SH were reviewed and updated.  ROS: Constitutional: Negative for fever, weight loss and weight gain. Cardiovascular: Negative for chest pain and dyspnea on exertion. Respiratory: Is not experiencing shortness of breath at rest. Gastrointestinal: Negative for nausea and vomiting. Neurological: Negative for headaches. Psychiatric: The patient is not nervous/anxious  Blood pressure (!) 146/81, pulse 71, SpO2 93%. There is no height or weight on file to calculate BMI.  PHYSICAL EXAM:  Exam: General: Well-developed, well-nourished Respiratory Respiratory effort: Equal inspiration and expiration without stridor Cardiovascular Peripheral Vascular: Warm extremities with equal color/perfusion Eyes: No nystagmus with equal extraocular motion bilaterally Neuro/Psych/Balance: Patient oriented to person, place, and time; Appropriate mood and affect; Gait is intact with no imbalance; Cranial nerves I-XII are intact Head and Face Inspection: Normocephalic and atraumatic without mass or lesion Palpation: Facial skeleton intact without bony stepoffs Salivary Glands: R parotidectomy incision c/d/I no erythema or swelling/fluctuance  Facial Strength: Facial motility symmetric and full bilaterally ENT Pinna: External ear intact and fully developed  External canal: Canal is patent with intact skin Tympanic Membrane: Clear and mobile External Nose: No scar or anatomic deformity Internal Nose: Septum is relatively straight on anterior rhinoscopy. No polyp, or purulence. Mucosal edema and erythema present.  Bilateral inferior turbinate hypertrophy.  Lips, Teeth, and gums: Mucosa and teeth intact and viable TMJ: No pain to palpation with full mobility Oral cavity/oropharynx: No erythema or exudate, no lesions present Neck Neck and  Trachea: Midline trachea without mass or lesion Thyroid : No mass or nodularity Lymphatics: No lymphadenopathy   Procedure: none  Studies reviewed CT chest 05/25/22 IMPRESSION: 1. Lung-RADS 2, benign appearance or behavior. Continue annual screening with low-dose chest CT without contrast in 12 months. 2. Aortic atherosclerosis. 3. Mild diffuse bronchial wall thickening with very mild centrilobular and paraseptal emphysema; imaging findings suggestive of underlying COPD.  CT neck w/con 08/12/23 IMPRESSION: 2.9 x 19 x 17 mm mass in the inferior aspect of the superficial lobe of the left parotid gland. This has only minimally enlarged over the last 6-1/2 years. This continues to argue in favor of a benign etiology, most likely diagnosis pleomorphic adenoma. Atypical Warthin's tumor is possible. Malignancy seems quite unlikely given the relative stability since 2018.   Assessment/Plan: Encounter Diagnoses  Name Primary?   Chronic nasal congestion Yes   Post-nasal drip    Environmental and seasonal allergies    Pleomorphic adenoma of parotid gland       Assessment and Plan    Parotid Gland Mass vs neck mass left side  Long-standing parotid gland/neck mass present for 6-7 years near angle of the mandible, previously imaged with a recommendation to remove it surgically, which patient elected not to follow. No bx before. We do not have access to imaging done years ago. Occasional discomfort when lying in certain positions. No biopsy history. Differential includes benign versus malignant parotid tumor vs neck mass of other origin/neoplasm/lymphadenopathy. Imaging and biopsy needed to determine nature and extent. Discussed potential for extensive surgery if malignant.  - Order neck CT w/con to evaluate parotid mass - Schedule follow-up to review imaging results - Plan biopsy based on imaging findings - Discuss surgical options post-biopsy  Dysphagia, hx of EGD esophageal dilation,  established with GI Dysphagia managed with upper endoscopy and esophageal dilation in 2023 due to difficulty swallowing and food impaction. Currently no issues with swallowing. - Monitor for recurrence of dysphagia symptoms  Sarcoidosis Sarcoidosis affecting arms and legs with lumps, no major organ involvement per report. Currently asymptomatic and not on medication. - Monitor for new symptoms or changes   Former Smoker Quit smoking 11-12 years ago. Scheduled for lung scan tomorrow as part of routine screening. - Proceed with scheduled lung scan - Review lung scan results at follow-up  Chronic GERD  - continue Protonix  40 mg 30 min prior to breakfast  -  Reflux Gourmet after meals - diet and lifestyle changes to minimize GERD  General Health Maintenance Hx of OSA not on CPAP or other treatments for it. BMI of 34.7 - consider repeat sleep study and visit with PCP or Sleep Medicine to discuss treatment options   Follow-up - Schedule follow-up in a few weeks to review imaging results and discuss biopsy - Provide contact information for neck scan scheduling if not contacted within a few days.     Update 12/11/2023 Assessment and Plan Assessment & Plan Left parotid gland mass, pleomorphic adenoma, s/p left superficial parotidectomy  - recovering as expected - continue wound care, instructions given  seasonal allergies and chronic nasal congestion Clear paranasal sinuses on CT neck  Annual sinus issues in the fall likely due to allergies, not chronic sinusitis. - Prescribed Flonase  twice daily. - Recommended switching to Xyzal  (usually using Claritin) Rx sent - Advised nasal saline rinses or neti pot for relief when symptoms occur  - Consider imaging if symptoms persist.  Elena Larry, MD Otolaryngology [facility] Phone: [phone] Fax: [phone]    12/11/2023, 1:21 PM

## 2024-04-02 ENCOUNTER — Telehealth: Payer: Self-pay | Admitting: Internal Medicine

## 2024-04-02 NOTE — Telephone Encounter (Signed)
 Medical records request to send to [facility]

## 2024-04-30 ENCOUNTER — Encounter: Payer: Self-pay | Admitting: Internal Medicine

## 2024-05-02 ENCOUNTER — Other Ambulatory Visit (HOSPITAL_COMMUNITY): Payer: Self-pay | Admitting: Student

## 2024-05-02 ENCOUNTER — Other Ambulatory Visit (HOSPITAL_COMMUNITY): Payer: Self-pay | Admitting: Internal Medicine

## 2024-05-02 DIAGNOSIS — R918 Other nonspecific abnormal finding of lung field: Secondary | ICD-10-CM

## 2024-05-02 DIAGNOSIS — Z1231 Encounter for screening mammogram for malignant neoplasm of breast: Secondary | ICD-10-CM

## 2024-05-15 ENCOUNTER — Ambulatory Visit (HOSPITAL_COMMUNITY): Admission: RE | Admit: 2024-05-15 | Discharge: 2024-05-15 | Attending: Internal Medicine | Admitting: Internal Medicine

## 2024-05-15 DIAGNOSIS — R918 Other nonspecific abnormal finding of lung field: Secondary | ICD-10-CM | POA: Insufficient documentation

## 2024-05-28 ENCOUNTER — Other Ambulatory Visit: Payer: Self-pay | Admitting: Internal Medicine

## 2024-06-03 ENCOUNTER — Ambulatory Visit (HOSPITAL_COMMUNITY)
Admission: RE | Admit: 2024-06-03 | Discharge: 2024-06-03 | Disposition: A | Discharge status: Home / Self Care | Source: Ambulatory Visit | Attending: Student | Admitting: Student

## 2024-06-03 DIAGNOSIS — Z1231 Encounter for screening mammogram for malignant neoplasm of breast: Secondary | ICD-10-CM | POA: Insufficient documentation
